# Patient Record
Sex: Female | Born: 1985 | Hispanic: Yes | Marital: Married | Smoking: Former smoker
Health system: Southern US, Community
[De-identification: ages and names within clinical notes are randomized; demographics above are authoritative.]

---

## 2018-03-22 ENCOUNTER — Emergency Department (HOSPITAL_COMMUNITY)
Admission: EM | Admit: 2018-03-22 | Discharge: 2018-03-22 | Disposition: A | Discharge status: Home / Self Care | Attending: Emergency Medicine | Admitting: Emergency Medicine

## 2018-03-22 ENCOUNTER — Emergency Department (HOSPITAL_COMMUNITY)

## 2018-03-22 ENCOUNTER — Encounter (HOSPITAL_COMMUNITY): Payer: Self-pay | Admitting: *Deleted

## 2018-03-22 ENCOUNTER — Other Ambulatory Visit: Payer: Self-pay

## 2018-03-22 DIAGNOSIS — F17210 Nicotine dependence, cigarettes, uncomplicated: Secondary | ICD-10-CM | POA: Diagnosis not present

## 2018-03-22 DIAGNOSIS — R519 Headache, unspecified: Secondary | ICD-10-CM

## 2018-03-22 DIAGNOSIS — R531 Weakness: Secondary | ICD-10-CM | POA: Diagnosis present

## 2018-03-22 DIAGNOSIS — R51 Headache: Secondary | ICD-10-CM | POA: Insufficient documentation

## 2018-03-22 DIAGNOSIS — R200 Anesthesia of skin: Secondary | ICD-10-CM

## 2018-03-22 LAB — I-STAT BETA HCG BLOOD, ED (MC, WL, AP ONLY): I-stat hCG, quantitative: <5 m[IU]/mL (ref <5)

## 2018-03-22 LAB — COMPREHENSIVE METABOLIC PANEL
ALT: 14 U/L (ref 0–44)
ANION GAP: 12 (ref 5–15)
AST: 19 U/L (ref 15–41)
Albumin: 3.6 g/dL (ref 3.5–5.0)
Alkaline Phosphatase: 73 U/L (ref 38–126)
BILIRUBIN TOTAL: 0.6 mg/dL (ref 0.3–1.2)
BUN: 7 mg/dL (ref 6–20)
CHLORIDE: 101 mmol/L (ref 98–111)
CO2: 25 mmol/L (ref 22–32)
Calcium: 9.1 mg/dL (ref 8.9–10.3)
Creatinine, Ser: 0.86 mg/dL (ref 0.44–1.00)
GFR calc Af Amer: >60 mL/min (ref >60)
GFR calc non Af Amer: >60 mL/min (ref >60)
Glucose, Bld: 114 mg/dL — ABNORMAL HIGH (ref 70–99)
POTASSIUM: 3.6 mmol/L (ref 3.5–5.1)
SODIUM: 138 mmol/L (ref 135–145)
TOTAL PROTEIN: 6.5 g/dL (ref 6.5–8.1)

## 2018-03-22 LAB — PROTIME-INR
INR: 0.98
Prothrombin Time: 12.9 seconds (ref 11.4–15.2)

## 2018-03-22 LAB — CBC
HCT: 41.3 % (ref 36.0–46.0)
Hemoglobin: 13.4 g/dL (ref 12.0–15.0)
MCH: 30.8 pg (ref 26.0–34.0)
MCHC: 32.4 g/dL (ref 30.0–36.0)
MCV: 94.9 fL (ref 78.0–100.0)
PLATELETS: 316 10*3/uL (ref 150–400)
RBC: 4.35 MIL/uL (ref 3.87–5.11)
RDW: 13.2 % (ref 11.5–15.5)
WBC: 5.2 10*3/uL (ref 4.0–10.5)

## 2018-03-22 LAB — I-STAT CHEM 8, ED
BUN: 8 mg/dL (ref 6–20)
Calcium, Ion: 1.2 mmol/L (ref 1.15–1.40)
Chloride: 101 mmol/L (ref 98–111)
Creatinine, Ser: 1 mg/dL (ref 0.44–1.00)
Glucose, Bld: 109 mg/dL — ABNORMAL HIGH (ref 70–99)
HEMATOCRIT: 40 % (ref 36.0–46.0)
Hemoglobin: 13.6 g/dL (ref 12.0–15.0)
POTASSIUM: 3.5 mmol/L (ref 3.5–5.1)
SODIUM: 137 mmol/L (ref 135–145)
TCO2: 26 mmol/L (ref 22–32)

## 2018-03-22 LAB — DIFFERENTIAL
ABS IMMATURE GRANULOCYTES: 0 10*3/uL (ref 0.0–0.1)
BASOS ABS: 0 10*3/uL (ref 0.0–0.1)
BASOS PCT: 1 %
EOS ABS: 0.1 10*3/uL (ref 0.0–0.7)
EOS PCT: 1 %
IMMATURE GRANULOCYTES: 0 %
LYMPHS ABS: 1.5 10*3/uL (ref 0.7–4.0)
Lymphocytes Relative: 28 %
Monocytes Absolute: 0.4 10*3/uL (ref 0.1–1.0)
Monocytes Relative: 8 %
NEUTROS PCT: 62 %
Neutro Abs: 3.2 10*3/uL (ref 1.7–7.7)

## 2018-03-22 LAB — APTT: APTT: 32 s (ref 24–36)

## 2018-03-22 LAB — I-STAT TROPONIN, ED: Troponin i, poc: 0.01 ng/mL (ref 0.00–0.08)

## 2018-03-22 MED ORDER — SODIUM CHLORIDE 0.9 % IV BOLUS: 1000.0000 mL | Freq: Once | INTRAVENOUS | Status: DC

## 2018-03-22 MED ORDER — DIPHENHYDRAMINE HCL 50 MG/ML IJ SOLN: 25.0000 mg | Freq: Once | INTRAMUSCULAR | Status: DC

## 2018-03-22 MED ORDER — PROCHLORPERAZINE EDISYLATE 10 MG/2ML IJ SOLN: 10.0000 mg | Freq: Once | INTRAMUSCULAR | Status: DC

## 2018-03-22 NOTE — ED Triage Notes (Signed)
Pt in c/o right sided headache and facial numbness, also numbness in right arm and leg, no weakness noted, normal strength in all extremities, reports decreased sensations to right arm and leg, and both sides of face

## 2018-03-22 NOTE — ED Notes (Signed)
Went to assess patient and pt no longer in room

## 2018-03-22 NOTE — ED Provider Notes (Signed)
[facility] EMERGENCY DEPARTMENT Provider Note   CSN: [id number] Arrival date & time: 03/22/18  1204     History   Chief Complaint Chief Complaint  Patient presents with  . Weakness    HPI Kathryn Henry is a 32 y.o. female.   Headache   This is a new problem. The current episode started 12 to 24 hours ago. The problem occurs constantly. The problem has been gradually worsening. The headache is associated with nothing. The quality of the pain is described as throbbing. The pain is at a severity of 6/10. Associated symptoms include nausea. Pertinent negatives include no fever, no palpitations, no shortness of breath and no vomiting. She has tried NSAIDs for the symptoms. The treatment provided mild relief.    History reviewed. No pertinent past medical history.  There are no active problems to display for this patient.   History reviewed. No pertinent surgical history.   OB History   None      Home Medications    Prior to Admission medications   Not on File    Family History History reviewed. No pertinent family history.  Social History Social History   Tobacco Use  . Smoking status: Current Every Day Smoker  . Smokeless tobacco: Never Used  Substance Use Topics  . Alcohol use: Not on file  . Drug use: Not on file     Allergies   Patient has no known allergies.   Review of Systems Review of Systems  Constitutional: Negative for chills and fever.  HENT: Negative for ear pain and sore throat.   Eyes: Negative for pain and visual disturbance.  Respiratory: Negative for cough and shortness of breath.   Cardiovascular: Negative for chest pain and palpitations.  Gastrointestinal: Positive for nausea. Negative for abdominal pain, diarrhea and vomiting.  Genitourinary: Negative for dysuria and frequency.  Musculoskeletal: Negative for back pain.  Skin: Negative for rash.  Neurological: Positive for headaches. Negative for seizures.    All other systems reviewed and are negative.    Physical Exam Updated Vital Signs BP 114/83   Pulse 94   Temp 98.2 F (36.8 C) (Oral)   Resp 16   Ht 5\' 5"  (1.651 m)   Wt 47.6 kg   SpO2 100%   BMI 17.47 kg/m   Physical Exam  Constitutional: She appears well-developed and well-nourished. No distress.  HENT:  Head: Normocephalic and atraumatic.  Eyes: Conjunctivae are normal.  Neck: Neck supple.  Cardiovascular: Normal rate and regular rhythm.  No murmur heard. Pulmonary/Chest: Effort normal and breath sounds normal. No respiratory distress.  Abdominal: Soft. There is no tenderness.  Musculoskeletal: She exhibits no edema.  Neurological: She is alert.  Skin: Skin is warm and dry.  Psychiatric: She has a normal mood and affect.  Nursing note and vitals reviewed.    ED Treatments / Results  Labs (all labs ordered are listed, but only abnormal results are displayed) Labs Reviewed  COMPREHENSIVE METABOLIC PANEL - Abnormal; Notable for the following components:      Result Value   Glucose, Bld 114 (*)    All other components within normal limits  I-STAT CHEM 8, ED - Abnormal; Notable for the following components:   Glucose, Bld 109 (*)    All other components within normal limits  PROTIME-INR  APTT  CBC  DIFFERENTIAL  I-STAT TROPONIN, ED  I-STAT BETA HCG BLOOD, ED ([facility], [facility], [facility] ONLY)    EKG None  Radiology Ct Head Wo Contrast  Result Date: 03/22/2018 CLINICAL DATA:  Right-sided headache and facial numbness. Right arm and leg numbness EXAM: CT HEAD WITHOUT CONTRAST TECHNIQUE: Contiguous axial images were obtained from the base of the skull through the vertex without intravenous contrast. COMPARISON:  None. FINDINGS: Brain: No evidence of acute infarction, hemorrhage, hydrocephalus, extra-axial collection or mass lesion/mass effect. Vascular: No hyperdense vessel or unexpected calcification. Skull: Negative Sinuses/Orbits: Negative Other: None IMPRESSION: Negative CT  head Electronically Signed   By: Marlan Palau M.D.   On: 03/22/2018 12:35    Procedures Procedures (including critical care time)  Medications Ordered in ED Medications - No data to display   Initial Impression / Assessment and Plan / ED Course  I have reviewed the triage vital signs and the nursing notes.  Pertinent labs & imaging results that were available during my care of the patient were reviewed by me and considered in my medical decision making (see chart for details).     The patient is a 32 year old female with no significant past medical history who presents with bilateral facial numbness and headache.  The patient denies weakness, although this is her chief complaint on arrival.  Physical exam reveals 5 out of 5 strength with flexion of bilateral upper and lower extremities.  The patient had intact sensation and no neglect on exam.  The patient is able to ambulate normally.  Patient had 2+ patellar and biceps reflexes.  Finger-to-nose normal bilaterally.  Do not suspect CVA, temporal arteritis, intracranial bleed based on history and physical.  Patient endorses pounding headache with photophobia, phonophobia, and nausea.  CT head shows no acute abnormalities.  CBC, CMP, troponin, and beta-hCG negative for abnormalities.  Suspect this is a migraine.  I ordered Compazine and Benadryl for the patient. When the patient's nurse went to assess her and give her medications the patient and her visitor (her manager) had left the room.  Awaiting for patient return, but she did not after 15 minutes and eloped.  Has not given the opportunity to give the patient return precautions, discharge instructions, and the patient did not receive her medications before leaving.  Patient care supervised by Dr. Freida Busman.  Nash Dimmer, MD   Final Clinical Impressions(s) / ED Diagnoses   Final diagnoses:  None    ED Discharge Orders    None       Nash Dimmer, MD 03/23/18 Willaim Sheng    Lorre Nick, MD 03/31/18 1228    Lorre Nick, MD 04/06/18 [phone]

## 2018-03-22 NOTE — ED Notes (Signed)
Pt still nowhere to be found; MD notified

## 2018-04-05 ENCOUNTER — Emergency Department (HOSPITAL_COMMUNITY)

## 2018-04-05 ENCOUNTER — Emergency Department (HOSPITAL_COMMUNITY)
Admission: EM | Admit: 2018-04-05 | Discharge: 2018-04-05 | Disposition: A | Discharge status: Home / Self Care | Attending: Emergency Medicine | Admitting: Emergency Medicine

## 2018-04-05 ENCOUNTER — Other Ambulatory Visit: Payer: Self-pay

## 2018-04-05 ENCOUNTER — Encounter (HOSPITAL_COMMUNITY): Payer: Self-pay

## 2018-04-05 DIAGNOSIS — R110 Nausea: Secondary | ICD-10-CM | POA: Insufficient documentation

## 2018-04-05 DIAGNOSIS — R079 Chest pain, unspecified: Secondary | ICD-10-CM

## 2018-04-05 DIAGNOSIS — Z87891 Personal history of nicotine dependence: Secondary | ICD-10-CM | POA: Insufficient documentation

## 2018-04-05 DIAGNOSIS — R0602 Shortness of breath: Secondary | ICD-10-CM | POA: Diagnosis not present

## 2018-04-05 LAB — CBC
HCT: 40 % (ref 36.0–46.0)
HEMOGLOBIN: 13.5 g/dL (ref 12.0–15.0)
MCH: 31.1 pg (ref 26.0–34.0)
MCHC: 33.8 g/dL (ref 30.0–36.0)
MCV: 92.2 fL (ref 78.0–100.0)
PLATELETS: 357 10*3/uL (ref 150–400)
RBC: 4.34 MIL/uL (ref 3.87–5.11)
RDW: 13.5 % (ref 11.5–15.5)
WBC: 7.7 10*3/uL (ref 4.0–10.5)

## 2018-04-05 LAB — POCT I-STAT TROPONIN I: Troponin i, poc: 0 ng/mL (ref 0.00–0.08)

## 2018-04-05 LAB — BASIC METABOLIC PANEL
ANION GAP: 10 (ref 5–15)
BUN: 10 mg/dL (ref 6–20)
CALCIUM: 8.9 mg/dL (ref 8.9–10.3)
CHLORIDE: 104 mmol/L (ref 98–111)
CO2: 27 mmol/L (ref 22–32)
CREATININE: 0.65 mg/dL (ref 0.44–1.00)
GFR calc Af Amer: >60 mL/min (ref >60)
Glucose, Bld: 92 mg/dL (ref 70–99)
POTASSIUM: 3.5 mmol/L (ref 3.5–5.1)
SODIUM: 141 mmol/L (ref 135–145)

## 2018-04-05 LAB — I-STAT BETA HCG BLOOD, ED (MC, WL, AP ONLY)

## 2018-04-05 NOTE — ED Triage Notes (Signed)
Patient c/o intermittent mid chest pain with SOB and nausea that began 3 days ago. CP began today at 1100.

## 2018-04-05 NOTE — ED Provider Notes (Signed)
[facility]-EMERGENCY DEPT Provider Note   CSN: [id number] Arrival date & time: 04/05/18  1308     History   Chief Complaint Chief Complaint  Patient presents with  . Chest Pain    HPI Kathryn Henry is a 32 y.o. female.  The history is provided by the patient.  Chest Pain   This is a recurrent problem. The current episode started more than 2 days ago. The problem occurs daily. The problem has been resolved. Associated with: Patient states every time she takes her prozac over the past week she gets chest pain, difficulty breathing and then it resolves. Has had bad reactions to prozac in past. Increased dose last week.  The pain is present in the substernal region. The pain is at a severity of 0/10. The patient is experiencing no pain. The quality of the pain is described as brief. The pain does not radiate. Associated symptoms include shortness of breath. Pertinent negatives include no abdominal pain, no back pain, no cough, no fever, no hemoptysis, no lower extremity edema, no malaise/fatigue, no nausea, no orthopnea, no palpitations, no syncope, no vomiting and no weakness. She has tried nothing for the symptoms. The treatment provided no relief. There are no known risk factors.  Pertinent negatives for past medical history include no seizures.  Pertinent negatives for family medical history include: no CAD.    History reviewed. No pertinent past medical history.  There are no active problems to display for this patient.   History reviewed. No pertinent surgical history.   OB History   None      Home Medications    Prior to Admission medications   Not on File    Family History History reviewed. No pertinent family history.  Social History Social History   Tobacco Use  . Smoking status: Former Games developermoker  . Smokeless tobacco: Never Used  Substance Use Topics  . Alcohol use: Never    Frequency: Never  . Drug use: Never     Allergies     Patient has no known allergies.   Review of Systems Review of Systems  Constitutional: Negative for chills, fever and malaise/fatigue.  HENT: Negative for ear pain and sore throat.   Eyes: Negative for pain and visual disturbance.  Respiratory: Positive for shortness of breath. Negative for cough and hemoptysis.   Cardiovascular: Positive for chest pain. Negative for palpitations, orthopnea and syncope.  Gastrointestinal: Negative for abdominal pain, nausea and vomiting.  Genitourinary: Negative for dysuria and hematuria.  Musculoskeletal: Negative for arthralgias and back pain.  Skin: Negative for color change and rash.  Neurological: Negative for seizures, syncope and weakness.  All other systems reviewed and are negative.    Physical Exam Updated Vital Signs  ED Triage Vitals  Enc Vitals Group     BP 04/05/18 1327 122/89     Pulse --      Resp 04/05/18 1327 14     Temp 04/05/18 1327 98.3 F (36.8 C)     Temp Source 04/05/18 1327 Oral     SpO2 04/05/18 1327 93 %     Weight 04/05/18 1327 105 lb (47.6 kg)     Height 04/05/18 1327 5\' 5"  (1.651 m)     Head Circumference --      Peak Flow --      Pain Score 04/05/18 1330 5     Pain Loc --      Pain Edu? --      Excl. in GC? --  Physical Exam  Constitutional: She appears well-developed and well-nourished. No distress.  HENT:  Head: Normocephalic and atraumatic.  Eyes: Pupils are equal, round, and reactive to light. Conjunctivae and EOM are normal.  Neck: Normal range of motion. Neck supple.  Cardiovascular: Normal rate, regular rhythm, intact distal pulses and normal pulses.  No murmur heard. Pulmonary/Chest: Effort normal and breath sounds normal. No respiratory distress. She has no decreased breath sounds. She has no wheezes. She has no rhonchi. She has no rales.  Abdominal: Soft. There is no tenderness.  Musculoskeletal: Normal range of motion. She exhibits no edema.       Right lower leg: She exhibits no  edema.       Left lower leg: She exhibits no edema.  Neurological: She is alert.  Skin: Skin is warm and dry. Capillary refill takes less than 2 seconds.  Psychiatric: She has a normal mood and affect.  Nursing note and vitals reviewed.    ED Treatments / Results  Labs (all labs ordered are listed, but only abnormal results are displayed) Labs Reviewed  BASIC METABOLIC PANEL  CBC  I-STAT TROPONIN, ED  I-STAT BETA HCG BLOOD, ED ([facility], [facility], [facility] ONLY)  POCT I-STAT TROPONIN I    EKG EKG Interpretation  Date/Time:  Tuesday April 05 2018 13:32:58 EDT Ventricular Rate:  95 PR Interval:    QRS Duration: 101 QT Interval:  342 QTC Calculation: 430 R Axis:   122 Text Interpretation:  Sinus rhythm Right atrial enlargement Right axis deviation Borderline T abnormalities, anterior leads Confirmed by Virgina Norfolk [phone]) on 04/05/2018 3:58:12 PM   Radiology Dg Chest 2 View  Result Date: 04/05/2018 CLINICAL DATA:  Intermittent episodes of mid chest pain, shortness of breath, and nausea over the past 3 days. EXAM: CHEST - 2 VIEW COMPARISON:  None. FINDINGS: The lungs are mildly hyperinflated. There is no focal infiltrate. There is no pleural effusion. The heart and pulmonary vascularity are normal. There is mild S shaped thoracolumbar scoliosis. The thoracic vertebral bodies are preserved in height. IMPRESSION: Mild hyperinflation may reflect reactive airway disease and the patient's smoking history. No pneumonia, CHF, nor other acute cardiopulmonary abnormality. Electronically Signed   By: David  [place] M.D.   On: 04/05/2018 15:03    Procedures Procedures (including critical care time)  Medications Ordered in ED Medications - No data to display   Initial Impression / Assessment and Plan / ED Course  I have reviewed the triage vital signs and the nursing notes.  Pertinent labs & imaging results that were available during my care of the patient were reviewed by me and considered in my  medical decision making (see chart for details).     Kathryn Henry is a 32 year old female history of depression who presents to the ED with chest pain.  Patient with unremarkable vitals.  No fever.  Patient with symptoms over the last several days that she thinks is associated with taking her Prozac.  She has history of side effects with Prozac in the past with paresthesias.  She states that since she had her Prozac increased last week she has had increased chest tightness, shortness of breath, nausea feeling.  Patient has no cardiac risk factors.  She is on estrogen for birth control but has no other PE risk factors.  Wells criteria 0 and no concern for PE.  Patient pain-free at this time.  Denies any cough, sputum production.  Chest x-ray showed no signs of pneumonia, no pneumothorax, no pleural effusion.  No significant anemia, electrolyte abnormality, kidney injury.  EKG shows sinus rhythm.  Troponin within normal limits.  Doubt cardiac process.  No cardiac history in the family.  Possibly patient with medication side effect.  Possible anxiety as well.  Recommend discontinuing Prozac at this time and following up with primary care doctor which she has in place. Discharged from ED good condition, given return precautions.   This chart was dictated using voice recognition software.  Despite best efforts to proofread,  errors can occur which can change the documentation meaning.   Final Clinical Impressions(s) / ED Diagnoses   Final diagnoses:  Chest pain, unspecified type    ED Discharge Orders    None       Virgina Norfolk, DO 04/05/18 1635

## 2018-04-28 DIAGNOSIS — F988 Other specified behavioral and emotional disorders with onset usually occurring in childhood and adolescence: Secondary | ICD-10-CM | POA: Insufficient documentation

## 2018-05-05 ENCOUNTER — Ambulatory Visit: Payer: Self-pay | Admitting: Psychiatry

## 2018-06-02 ENCOUNTER — Other Ambulatory Visit: Payer: Self-pay | Admitting: Psychiatry

## 2018-06-02 MED ORDER — DULOXETINE HCL 60 MG PO CPEP: 60.0000 mg | ORAL_CAPSULE | Freq: Every day | ORAL | 0 refills | Status: DC

## 2018-06-02 MED ORDER — AMPHETAMINE-DEXTROAMPHETAMINE 30 MG PO TABS: 75.0000 mg | ORAL_TABLET | Freq: Every day | ORAL | 0 refills | Status: DC | PRN

## 2018-06-27 ENCOUNTER — Ambulatory Visit (INDEPENDENT_AMBULATORY_CARE_PROVIDER_SITE_OTHER): Admitting: Psychiatry

## 2018-06-27 DIAGNOSIS — F411 Generalized anxiety disorder: Secondary | ICD-10-CM

## 2018-06-27 DIAGNOSIS — F429 Obsessive-compulsive disorder, unspecified: Secondary | ICD-10-CM | POA: Diagnosis not present

## 2018-06-27 DIAGNOSIS — F988 Other specified behavioral and emotional disorders with onset usually occurring in childhood and adolescence: Secondary | ICD-10-CM

## 2018-06-27 DIAGNOSIS — F3289 Other specified depressive episodes: Secondary | ICD-10-CM

## 2018-06-27 MED ORDER — FLUOXETINE HCL 20 MG PO TABS: ORAL_TABLET | ORAL | 1 refills | Status: DC

## 2018-06-27 MED ORDER — AMPHETAMINE-DEXTROAMPHETAMINE 30 MG PO TABS: 75.0000 mg | ORAL_TABLET | Freq: Every day | ORAL | 0 refills | Status: DC | PRN

## 2018-06-27 MED ORDER — ARIPIPRAZOLE 2 MG PO TABS: ORAL_TABLET | ORAL | 0 refills | Status: DC

## 2018-06-27 MED ORDER — GABAPENTIN 600 MG PO TABS: 600.0000 mg | ORAL_TABLET | Freq: Two times a day (BID) | ORAL | 1 refills | Status: DC

## 2018-06-27 MED ORDER — DIAZEPAM 5 MG PO TABS: 5.0000 mg | ORAL_TABLET | Freq: Every day | ORAL | 0 refills | Status: DC | PRN

## 2018-06-27 NOTE — Progress Notes (Signed)
[facility]  Patient ID: Zane HeraldCynthia Henry,  MRN: [id number]  PCP: Patient, No Pcp Per  Date of Evaluation: 06/27/2018 Time spent:20 minutes  Chief Complaint:   HISTORY/CURRENT STATUS: HPI patient last seen 03/31/2018.  Her depression was better.  Continued with daily anxiety.  We increased her Prozac and continued her Adderall.  Patient called a week later with heartburn after she takes the Prozac 40.  She was switched from Prozac to Cymbalta.  Since switching to Cymbalta he notices an increased anxiety, decreased energy, forgetfulness.  Her depression is also worse. Current medications are Cymbalta 60 mg 1 a day Valium 5 mg 1 a day, Neurontin 600 mg 1-2 a day, Adderall 30 mg 2-1/2 a day. She did well on Prozac 40 mg.  When we increase it to 60 she had side effects. Past psychiatric medications include Paxil, Prozac, Viibryd, Wellbutrin, Valium, gabapentin, Lexapro, Adderall.   Individual Medical History/ Review of Systems: Changes? :none  Allergies: Patient has no known allergies.  Current Medications:  Current Outpatient Medications:  .  amphetamine-dextroamphetamine (ADDERALL) 30 MG tablet, Take 2.5 tablets by mouth daily as needed. For energy, Disp: 75 tablet, Rfl: 0 .  diazepam (VALIUM) 5 MG tablet, Take 1 tablet (5 mg total) by mouth daily as needed., Disp: 30 tablet, Rfl: 0 .  gabapentin (NEURONTIN) 600 MG tablet, Take 1 tablet (600 mg total) by mouth 2 (two) times daily., Disp: 60 tablet, Rfl: 1 .  Adapalene 0.3 % gel, Apply 1 application topically at bedtime. , Disp: , Rfl: 1 .  ARIPiprazole (ABILIFY) 2 MG tablet, 1/2 tab /day for a week then 1/day, Disp: 30 tablet, Rfl: 0 .  FLUoxetine (PROZAC) 20 MG tablet, 1 tab/day for 1 week, then 2 tabs/day, Disp: 60 tablet, Rfl: 1 .  JUNEL FE 1/20 1-20 MG-MCG tablet, Take 1 tablet by mouth daily., Disp: , Rfl: 0 .  ketoconazole (NIZORAL) 2 % cream, Apply 1 application topically daily., Disp: , Rfl: 2 .  rizatriptan  (MAXALT-MLT) 10 MG disintegrating tablet, Take 10 mg by mouth as needed for migraine. , Disp: , Rfl: 0 Medication Side Effects: Decreased energy forgetfulness, Family Medical/ Social History: Changes? no  MENTAL HEALTH EXAM:  There were no vitals taken for this visit.There is no height or weight on file to calculate BMI.  General Appearance: Casual  Eye Contact:  Good  Speech:  Clear and Coherent  Volume:  Normal  Mood:  Anxious and Depressed  Affect:  Appropriate  Thought Process:  Linear  Orientation:  Full (Time, Place, and Person)  Thought Content: Logical   Suicidal Thoughts:  No  Homicidal Thoughts:  No  Memory:  WNL  Judgement:  Good  Insight:  Good  Psychomotor Activity:  Normal  Concentration:  Concentration: Good  Recall:  good  Fund of Knowledge: Good  Language: Good  Assets:  Desire for Improvement  ADL's:  Intact  Cognition: WNL  Prognosis:  Good    DIAGNOSES:    ICD-10-CM   1. Attention deficit disorder, unspecified hyperactivity presence F98.8   2. Other depression F32.89   3. Anxiety state F41.1   4. Obsessive-compulsive disorder, unspecified type F42.9     Receiving Psychotherapy: No    RECOMMENDATIONS: We could try Zoloft but she is already failed from 3 antidepressants.  Patient desires to go on to back on Prozac  to do that 20 mg a day for a week and then 40 mg a day.  Also try to start her on  Abilify 1 mg a day for a week and then 2 mg  For augmentation of her Prozac.   Anne Fu, PA-C

## 2018-06-28 ENCOUNTER — Telehealth: Payer: Self-pay | Admitting: Psychiatry

## 2018-06-28 ENCOUNTER — Other Ambulatory Visit: Payer: Self-pay | Admitting: Psychiatry

## 2018-06-28 MED ORDER — VORTIOXETINE HBR 10 MG PO TABS: ORAL_TABLET | ORAL | 1 refills | Status: DC

## 2018-06-28 NOTE — Telephone Encounter (Signed)
Pt seen yesterday and restarted on Prozac 20mg /day. 1 hr after dose, she had chest pain. Still present but better now. Sore to touch. To d/c prozac Start trintellix.5-[facility] [address] in [place].

## 2018-07-11 ENCOUNTER — Other Ambulatory Visit: Payer: Self-pay | Admitting: Psychiatry

## 2018-07-14 ENCOUNTER — Other Ambulatory Visit: Payer: Self-pay | Admitting: Psychiatry

## 2018-07-14 MED ORDER — DULOXETINE HCL 30 MG PO CPEP: ORAL_CAPSULE | ORAL | 0 refills | Status: DC

## 2018-07-14 NOTE — Progress Notes (Signed)
Pt with problems tapering off Cymbalta. Called in cymbalta 30 mg/day 2/day for 2 weeks, then 1 day for 2 weeks. Then stop.

## 2018-07-20 ENCOUNTER — Encounter: Payer: Self-pay | Admitting: Psychiatry

## 2018-07-20 ENCOUNTER — Telehealth: Payer: Self-pay | Admitting: Psychiatry

## 2018-07-20 NOTE — Telephone Encounter (Signed)
Pt. Called and wants to talk to you about the tapering off of the cymbalta. Please call her at [phone].

## 2018-07-25 ENCOUNTER — Ambulatory Visit (INDEPENDENT_AMBULATORY_CARE_PROVIDER_SITE_OTHER): Admitting: Psychiatry

## 2018-07-25 DIAGNOSIS — F329 Major depressive disorder, single episode, unspecified: Secondary | ICD-10-CM | POA: Diagnosis not present

## 2018-07-25 DIAGNOSIS — F411 Generalized anxiety disorder: Secondary | ICD-10-CM

## 2018-07-25 DIAGNOSIS — F32A Depression, unspecified: Secondary | ICD-10-CM

## 2018-07-25 DIAGNOSIS — F988 Other specified behavioral and emotional disorders with onset usually occurring in childhood and adolescence: Secondary | ICD-10-CM

## 2018-07-25 MED ORDER — DIAZEPAM 5 MG PO TABS: 5.0000 mg | ORAL_TABLET | Freq: Every day | ORAL | 0 refills | Status: DC | PRN

## 2018-07-25 MED ORDER — PAROXETINE HCL 10 MG PO TABS: 10.0000 mg | ORAL_TABLET | Freq: Every day | ORAL | 0 refills | Status: DC

## 2018-07-25 MED ORDER — DULOXETINE HCL 20 MG PO CPEP: 20.0000 mg | ORAL_CAPSULE | Freq: Every day | ORAL | 0 refills | Status: DC

## 2018-07-25 MED ORDER — ARIPIPRAZOLE 2 MG PO TABS: ORAL_TABLET | ORAL | 0 refills | Status: DC

## 2018-07-25 MED ORDER — GABAPENTIN 600 MG PO TABS: 600.0000 mg | ORAL_TABLET | Freq: Two times a day (BID) | ORAL | 1 refills | Status: DC

## 2018-07-25 MED ORDER — AMPHETAMINE-DEXTROAMPHETAMINE 30 MG PO TABS: 75.0000 mg | ORAL_TABLET | Freq: Every day | ORAL | 0 refills | Status: DC | PRN

## 2018-07-25 NOTE — Progress Notes (Signed)
[facility]  Patient ID: Kathryn Henry,  MRN: [id number]  PCP: Patient, No Pcp Per  Date of Evaluation: 07/25/2018 Time spent:20 minutes  Chief Complaint:   HISTORY/CURRENT STATUS: HPI patient last seen 06/27/2018.  He was unable to tolerate the Prozac so he switched to Trintellix.  She stopped Trintellix new due to nausea.  He is also been on and Cymbalta but she has had increased anxiety we have tapered we are tapering her off very slowly.  She is tried Viibryd in the past where she had nightmares. Currently she is having no depression but has anxiety. Individual Medical History/ Review of Systems: Changes? :No   Allergies: Patient has no known allergies.  Current Medications:  Current Outpatient Medications:  .  Adapalene 0.3 % gel, Apply 1 application topically at bedtime. , Disp: , Rfl: 1 .  amphetamine-dextroamphetamine (ADDERALL) 30 MG tablet, Take 2.5 tablets by mouth daily as needed. For energy, Disp: 75 tablet, Rfl: 0 .  ARIPiprazole (ABILIFY) 2 MG tablet, 1/2 tab /day for a week then 1/day, Disp: 30 tablet, Rfl: 0 .  diazepam (VALIUM) 5 MG tablet, Take 1 tablet (5 mg total) by mouth daily as needed., Disp: 30 tablet, Rfl: 0 .  DULoxetine (CYMBALTA) 30 MG capsule, 2 a day for 2 weeks, then 1 /day for 2 weeks. Then stop., Disp: 42 capsule, Rfl: 0 .  FLUoxetine (PROZAC) 20 MG tablet, 1 tab/day for 1 week, then 2 tabs/day, Disp: 60 tablet, Rfl: 1 .  gabapentin (NEURONTIN) 600 MG tablet, Take 1 tablet (600 mg total) by mouth 2 (two) times daily., Disp: 60 tablet, Rfl: 1 .  JUNEL FE 1/20 1-20 MG-MCG tablet, Take 1 tablet by mouth daily., Disp: , Rfl: 0 .  ketoconazole (NIZORAL) 2 % cream, Apply 1 application topically daily., Disp: , Rfl: 2 .  rizatriptan (MAXALT-MLT) 10 MG disintegrating tablet, Take 10 mg by mouth as needed for migraine. , Disp: , Rfl: 0 .  vortioxetine HBr (TRINTELLIX) 10 MG TABS tablet, 1/2 tab/day for a week, then 1 tab/day.1, Disp: 30 tablet,  Rfl: 1 Medication Side Effects: none  Family Medical/ Social History: Changes? No  MENTAL HEALTH EXAM:  There were no vitals taken for this visit.There is no height or weight on file to calculate BMI.  General Appearance: Casual  Eye Contact:  Good  Speech:  Normal Rate  Volume:  Normal  Mood:  Euthymic  Affect:  Appropriate  Thought Process:  Linear  Orientation:  Full (Time, Place, and Person)  Thought Content: Logical   Suicidal Thoughts:  No  Homicidal Thoughts:  No  Memory:  WNL  Judgement:  Good  Insight:  Good  Psychomotor Activity:  Normal  Concentration:  Concentration: Good  Recall:  Good  Fund of Knowledge: Good  Language: Good  Assets:  Desire for Improvement  ADL's:  Intact  Cognition: WNL  Prognosis:  Good    DIAGNOSES: No diagnosis found.  Receiving Psychotherapy: No    RECOMMENDATIONS: She is to continue tapering off the Cymbalta.  She is to start Paxil 10 mg a day.  Continue Abilify 2 mg a day on Valium 5 mg a day.  On gabapentin.  She is to return in 1 month.   Anne Fu, PA-C

## 2018-07-26 ENCOUNTER — Ambulatory Visit: Admitting: Psychiatry

## 2018-08-22 ENCOUNTER — Other Ambulatory Visit: Payer: Self-pay | Admitting: Psychiatry

## 2018-09-21 ENCOUNTER — Other Ambulatory Visit: Payer: Self-pay | Admitting: Psychiatry

## 2018-09-22 ENCOUNTER — Other Ambulatory Visit: Payer: Self-pay

## 2018-09-22 ENCOUNTER — Telehealth: Payer: Self-pay | Admitting: Psychiatry

## 2018-09-22 ENCOUNTER — Other Ambulatory Visit: Payer: Self-pay | Admitting: Psychiatry

## 2018-09-22 MED ORDER — GABAPENTIN 600 MG PO TABS: 600.0000 mg | ORAL_TABLET | Freq: Two times a day (BID) | ORAL | 0 refills | Status: DC

## 2018-09-22 NOTE — Telephone Encounter (Signed)
Spoke with pt and she has completely tapered off duloxetine, did not like the paroxetine. Asking to go back on Duloxetine. Has office visit Monday 03/16 with provider. Will submit refill for gabapentin 600mg  bid today.

## 2018-09-22 NOTE — Telephone Encounter (Signed)
Patient scheduled appt. For Monday but needs refills on Cymbalta and Gabapentin to be sent to [facility] on file

## 2018-09-26 ENCOUNTER — Telehealth: Payer: Self-pay | Admitting: Psychiatry

## 2018-09-26 ENCOUNTER — Ambulatory Visit: Admitting: Psychiatry

## 2018-09-26 ENCOUNTER — Other Ambulatory Visit: Payer: Self-pay

## 2018-09-26 MED ORDER — AMPHETAMINE-DEXTROAMPHETAMINE 30 MG PO TABS: 75.0000 mg | ORAL_TABLET | Freq: Every day | ORAL | 0 refills | Status: DC | PRN

## 2018-09-26 NOTE — Telephone Encounter (Signed)
rx submitted to provider for approval  

## 2018-09-26 NOTE — Telephone Encounter (Signed)
Patient requesting refill on Adderall. Please fill at the [facility] on [address].

## 2018-09-28 ENCOUNTER — Telehealth: Payer: Self-pay | Admitting: Psychiatry

## 2018-09-28 ENCOUNTER — Other Ambulatory Visit: Payer: Self-pay

## 2018-09-28 MED ORDER — DULOXETINE HCL 30 MG PO CPEP: 30.0000 mg | ORAL_CAPSULE | Freq: Every day | ORAL | 0 refills | Status: DC

## 2018-09-28 NOTE — Progress Notes (Signed)
Pt asking to get a rx for duloxetine 30mg , tried to taper down to 20mg  but unsuccessful. rx submitted per request.

## 2018-09-28 NOTE — Telephone Encounter (Signed)
rx submitted.  

## 2018-09-28 NOTE — Telephone Encounter (Signed)
Yes it is okay to increase duloxetine back to 30 mg daily

## 2018-09-28 NOTE — Telephone Encounter (Signed)
Patient called and said that she wants to go back up on the cymbalta 30 mg. She was trying to decrease to 20 mg but not doing well wants to go back up. Please escribe to the [facility] on [address]

## 2018-10-19 ENCOUNTER — Other Ambulatory Visit: Payer: Self-pay | Admitting: Psychiatry

## 2018-10-25 ENCOUNTER — Other Ambulatory Visit: Payer: Self-pay | Admitting: Psychiatry

## 2019-01-08 IMAGING — CR DG CHEST 2V
2 series · 2 of 2 positions shown · non-contrast
Comparison: None.

CLINICAL DATA: Intermittent episodes of mid chest pain, shortness
of breath, and nausea over the past 3 days.

EXAM:
CHEST - 2 VIEW

[w chest pa]
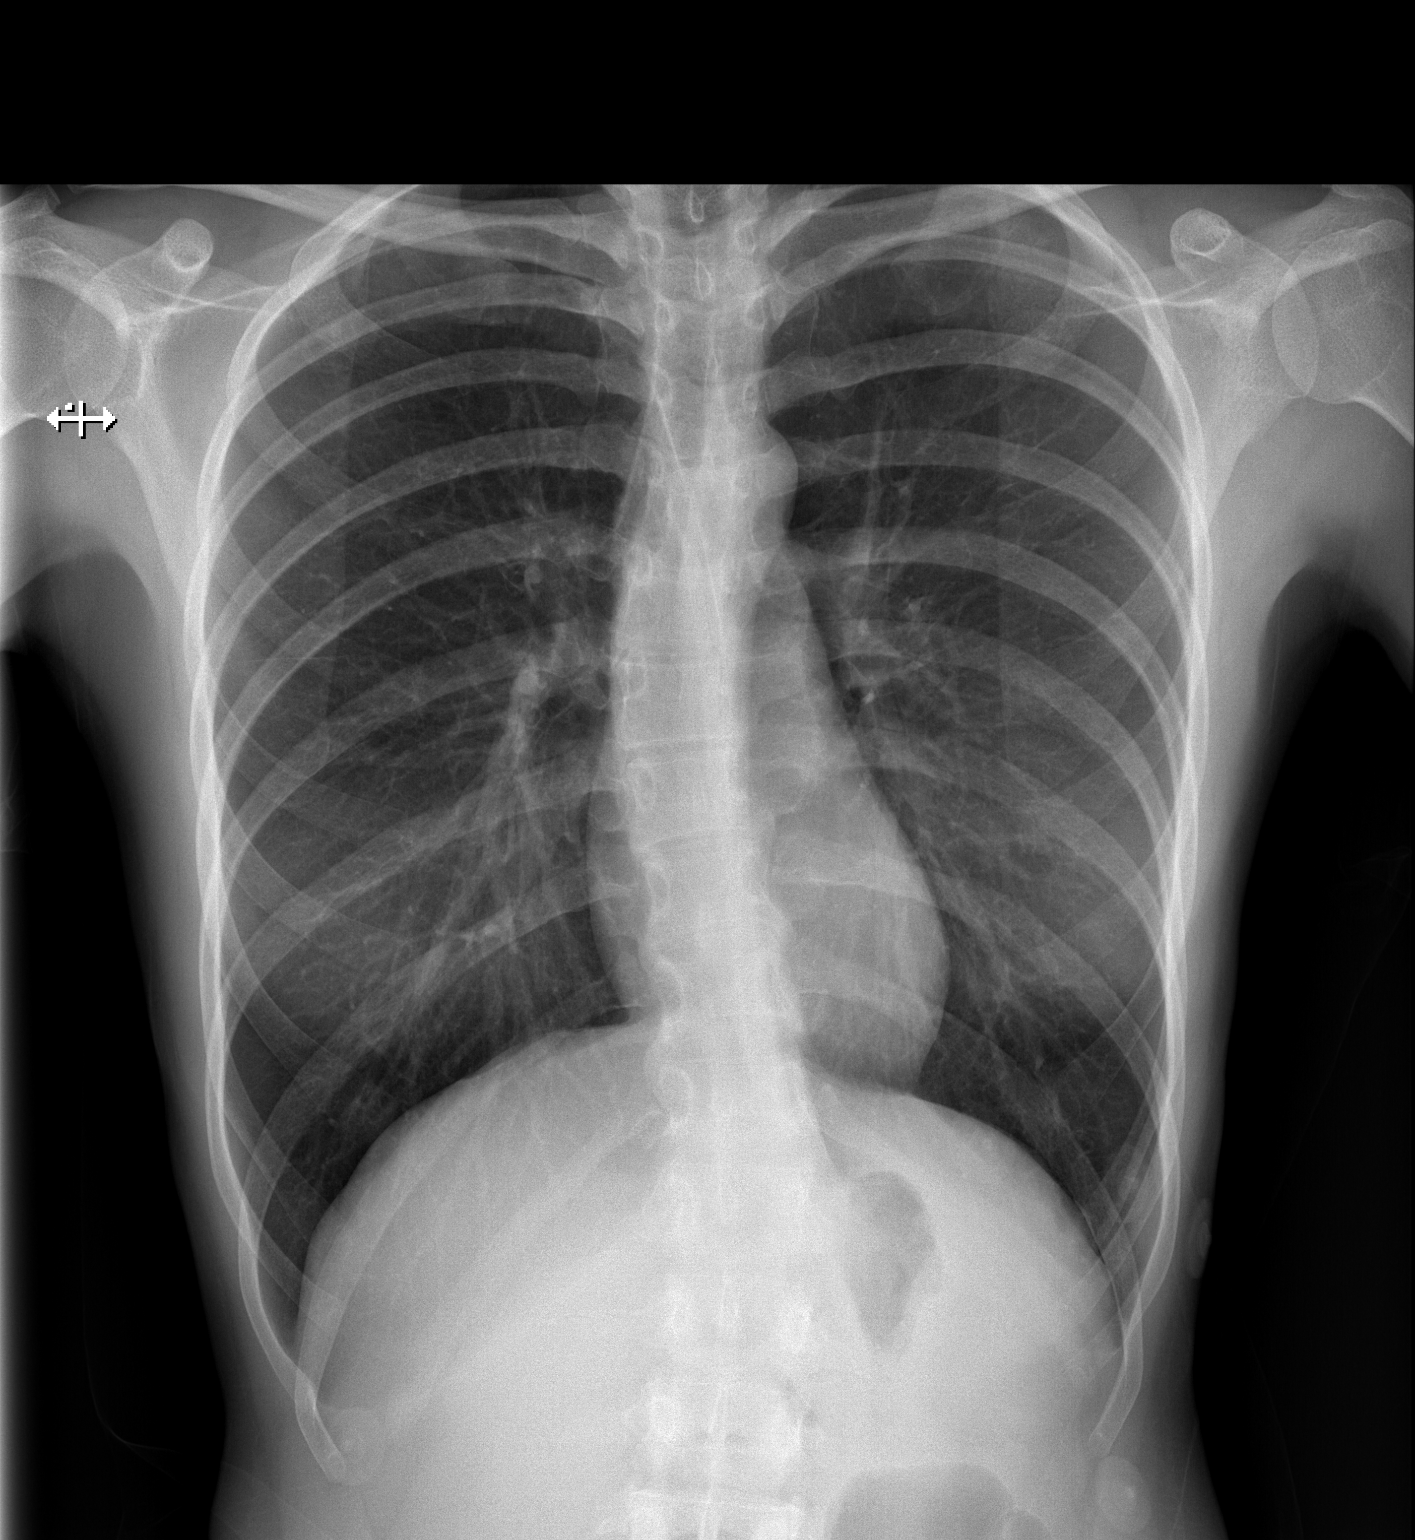

[w chest lat]
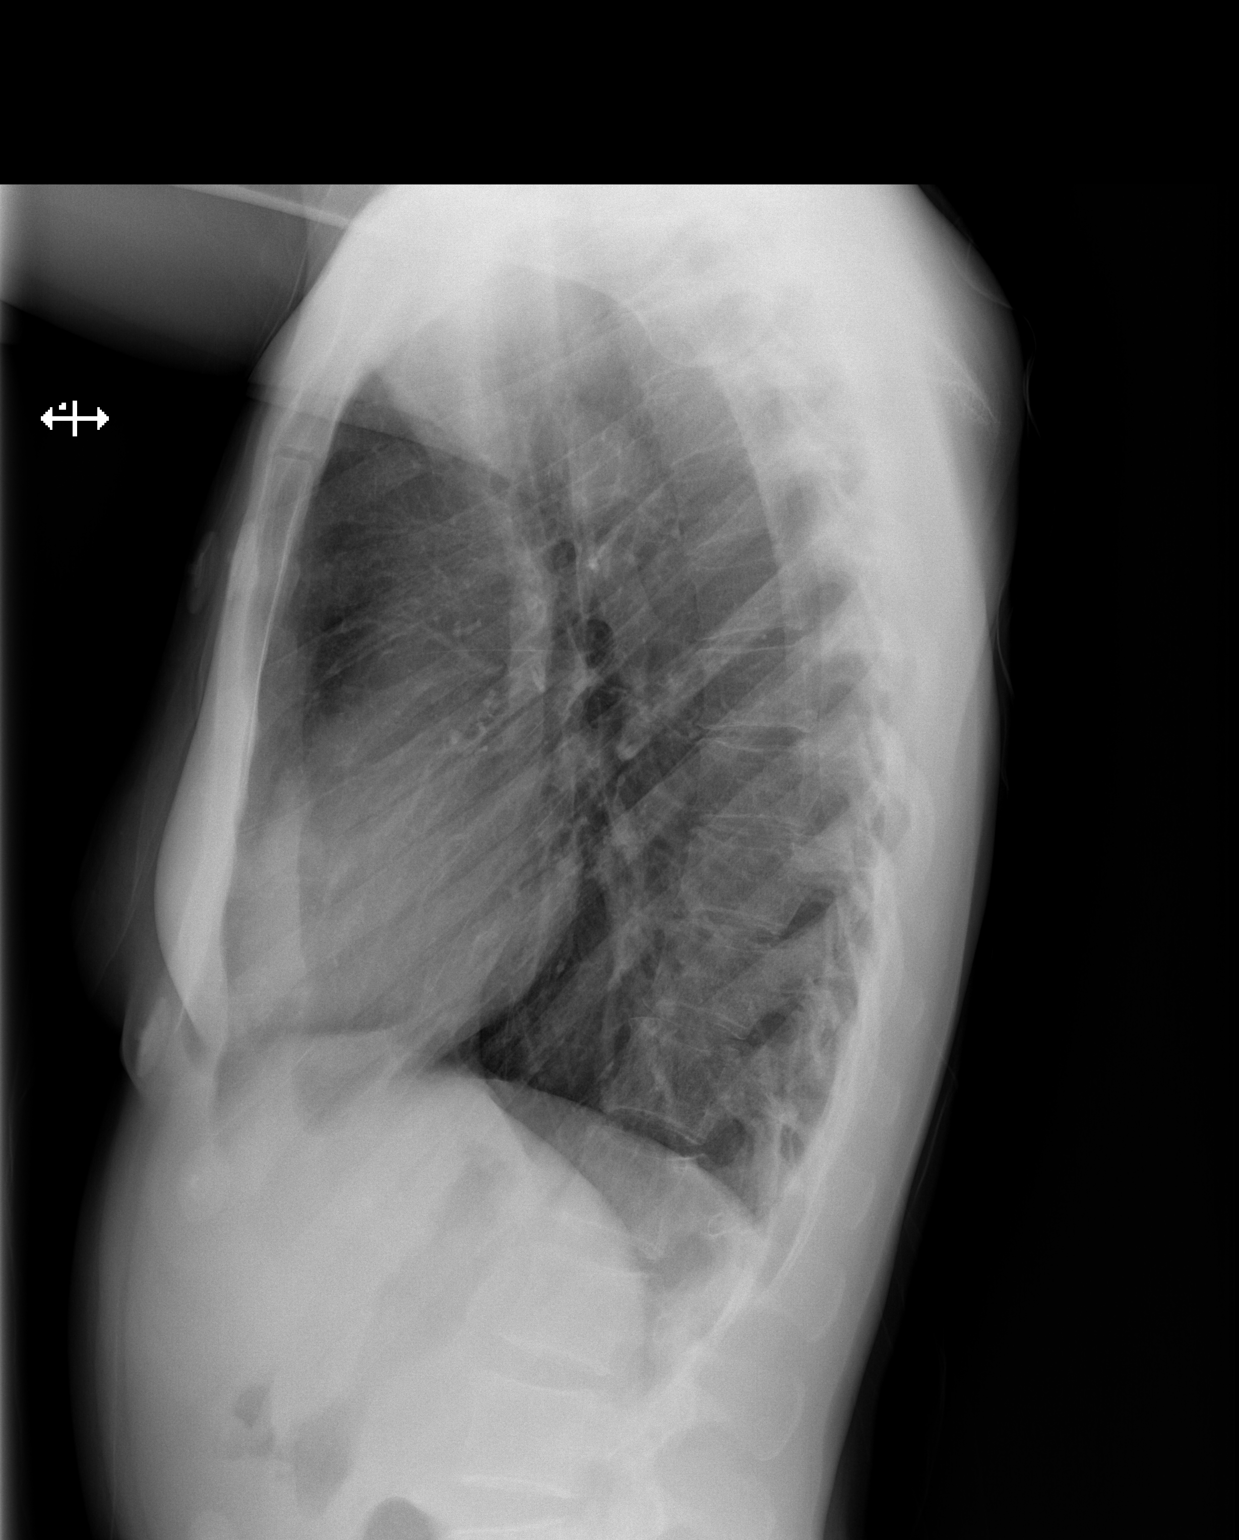

[2 of 2 positions shown; findings below may reference images not displayed]

FINDINGS: The lungs are mildly hyperinflated. There is no focal infiltrate.
There is no pleural effusion. The heart and pulmonary vascularity
are normal. There is mild S shaped thoracolumbar scoliosis. The
thoracic vertebral bodies are preserved in height.
IMPRESSION: Mild hyperinflation may reflect reactive airway disease and the
patient's smoking history. No pneumonia, CHF, nor other acute
cardiopulmonary abnormality.

## 2019-01-09 ENCOUNTER — Other Ambulatory Visit: Payer: Self-pay | Admitting: Psychiatry

## 2019-01-09 NOTE — Telephone Encounter (Signed)
Needs appt

## 2019-01-11 ENCOUNTER — Other Ambulatory Visit: Payer: Self-pay | Admitting: Psychiatry

## 2019-01-12 NOTE — Telephone Encounter (Signed)
Kathryn Henry called to request refill of her duloxetine, gabapentin and Adderall since she can't get an appt right away.  She was a [employer] pt.  She is now scheduled to see [name] 02/20/19.  [facility] send to [facility] on [address].

## 2019-01-13 ENCOUNTER — Other Ambulatory Visit: Payer: Self-pay

## 2019-01-16 MED ORDER — GABAPENTIN 600 MG PO TABS: ORAL_TABLET | ORAL | 1 refills | Status: DC

## 2019-01-16 MED ORDER — AMPHETAMINE-DEXTROAMPHETAMINE 30 MG PO TABS: 75.0000 mg | ORAL_TABLET | Freq: Every day | ORAL | 0 refills | Status: DC | PRN

## 2019-02-20 ENCOUNTER — Ambulatory Visit: Admitting: Physician Assistant

## 2019-02-27 ENCOUNTER — Encounter: Payer: Self-pay | Admitting: Adult Health

## 2019-02-27 ENCOUNTER — Other Ambulatory Visit: Payer: Self-pay

## 2019-02-27 ENCOUNTER — Ambulatory Visit (INDEPENDENT_AMBULATORY_CARE_PROVIDER_SITE_OTHER): Admitting: Adult Health

## 2019-02-27 VITALS — Ht 67.0 in | Wt 127.0 lb

## 2019-02-27 DIAGNOSIS — F329 Major depressive disorder, single episode, unspecified: Secondary | ICD-10-CM | POA: Insufficient documentation

## 2019-02-27 DIAGNOSIS — F331 Major depressive disorder, recurrent, moderate: Secondary | ICD-10-CM

## 2019-02-27 DIAGNOSIS — F3341 Major depressive disorder, recurrent, in partial remission: Secondary | ICD-10-CM

## 2019-02-27 DIAGNOSIS — F902 Attention-deficit hyperactivity disorder, combined type: Secondary | ICD-10-CM | POA: Diagnosis not present

## 2019-02-27 DIAGNOSIS — G4700 Insomnia, unspecified: Secondary | ICD-10-CM

## 2019-02-27 DIAGNOSIS — F411 Generalized anxiety disorder: Secondary | ICD-10-CM

## 2019-02-27 MED ORDER — AMPHETAMINE-DEXTROAMPHET ER 30 MG PO CP24: 30.0000 mg | ORAL_CAPSULE | Freq: Every day | ORAL | 0 refills | Status: DC

## 2019-02-27 MED ORDER — DULOXETINE HCL 60 MG PO CPEP: 60.0000 mg | ORAL_CAPSULE | Freq: Every day | ORAL | 2 refills | Status: DC

## 2019-02-27 MED ORDER — DIAZEPAM 5 MG PO TABS: 5.0000 mg | ORAL_TABLET | Freq: Every day | ORAL | 2 refills | Status: DC | PRN

## 2019-02-27 MED ORDER — GABAPENTIN 600 MG PO TABS: ORAL_TABLET | ORAL | 2 refills | Status: DC

## 2019-02-27 NOTE — Progress Notes (Signed)
Kathryn HeraldCynthia Faust [id number] 1985-12-07 33 y.o.  Subjective:   Patient ID:  Kathryn Henry is a 33 y.o. (DOB 1985-12-07) female.  Chief Complaint:  Chief Complaint  Patient presents with  . Depression  . Anxiety  . ADHD  . Insomnia    HPI Kathryn HeraldCynthia Brothers presents to the office today for follow-up of depression, anxiety, ADHD, and insomnia.   Describes mood today as "ok". Pleasant. Flat. Mood symptoms - decreased depression, anxiety, and irritability. Has felt more irritable lately. Getting "frustrated" at patients coming into office - not wanting to comply with new regulations. Stating "I'm doing better". Had tapered herself off al medications in June, and then realized she needed them. Had stopped getting out of bed in the mornings, but having a easier time now. Stable interest and motivation Taking medications as prescribed.  Energy levels "so-so". Relies on Adderall for energy. Has reduced dose and is now taking 30mg  daily - (15 bid).  Enjoys some usual interests and activities. Spending time with family. Has a 33 year old daughter. Daughter visits with her father every summer for the past 4 years. Father moving to [place] next year.  Appetite adequate. Weight gain - "a few pounds". Sleeps well most nights. Averages 8 to 9 hours. Focus and concentration stable. Completing tasks. Managing aspects of household. Work going well. Was diagnosed with ADHD in  2015. Adderall has historically worked well.  Denies SI or HI. Denies AH or VH.   Review of Systems:  Review of Systems  Musculoskeletal: Negative for gait problem.  Neurological: Negative for tremors.  Psychiatric/Behavioral:       Please refer to HPI    Medications: I have reviewed the patient's current medications.  Current Outpatient Medications  Medication Sig Dispense Refill  . Adapalene 0.3 % gel Apply 1 application topically at bedtime.   1  . amitriptyline (ELAVIL) 10 MG tablet Take 20 mg by mouth at bedtime as needed.     Marland Kitchen. amphetamine-dextroamphetamine (ADDERALL) 30 MG tablet Take 2.5 tablets by mouth daily as needed. For energy 75 tablet 0  . diazepam (VALIUM) 5 MG tablet Take 1 tablet (5 mg total) by mouth daily as needed. 30 tablet 2  . DULoxetine (CYMBALTA) 30 MG capsule TAKE 1 CAPSULE(30 MG) BY MOUTH DAILY 30 capsule 1  . gabapentin (NEURONTIN) 600 MG tablet TAKE 1 TABLET(600 MG) BY MOUTH TWICE DAILY 60 tablet 2  . JUNEL FE 1/20 1-20 MG-MCG tablet Take 1 tablet by mouth daily.  0  . ketoconazole (NIZORAL) 2 % cream Apply 1 application topically daily.  2  . amphetamine-dextroamphetamine (ADDERALL XR) 30 MG 24 hr capsule Take 1 capsule (30 mg total) by mouth daily. 30 capsule 0  . ARIPiprazole (ABILIFY) 2 MG tablet TAKE 1 TABLET BY MOUTH EVERY DAY (Patient not taking: Reported on 02/27/2019) 30 tablet 0  . DULoxetine (CYMBALTA) 60 MG capsule Take 1 capsule (60 mg total) by mouth daily. 30 capsule 2  . omeprazole (PRILOSEC) 20 MG capsule TK 1 C PO BID    . PARoxetine (PAXIL) 10 MG tablet TAKE 1 TABLET(10 MG) BY MOUTH DAILY (Patient not taking: Reported on 02/27/2019) 30 tablet 0  . rizatriptan (MAXALT-MLT) 10 MG disintegrating tablet Take 10 mg by mouth as needed for migraine.   0   No current facility-administered medications for this visit.     Medication Side Effects: None  Allergies: No Known Allergies  No past medical history on file.  No family history on file.  Social History  Socioeconomic History  . Marital status: Married    Spouse name: Not on file  . Number of children: Not on file  . Years of education: Not on file  . Highest education level: Not on file  Occupational History  . Not on file  Social Needs  . Financial resource strain: Not on file  . Food insecurity    Worry: Not on file    Inability: Not on file  . Transportation needs    Medical: Not on file    Non-medical: Not on file  Tobacco Use  . Smoking status: Former Games developermoker  . Smokeless tobacco: Never Used   Substance and Sexual Activity  . Alcohol use: Never    Frequency: Never  . Drug use: Never  . Sexual activity: Not on file  Lifestyle  . Physical activity    Days per week: Not on file    Minutes per session: Not on file  . Stress: Not on file  Relationships  . Social Musicianconnections    Talks on phone: Not on file    Gets together: Not on file    Attends religious service: Not on file    Active member of club or organization: Not on file    Attends meetings of clubs or organizations: Not on file    Relationship status: Not on file  . Intimate partner violence    Fear of current or ex partner: Not on file    Emotionally abused: Not on file    Physically abused: Not on file    Forced sexual activity: Not on file  Other Topics Concern  . Not on file  Social History Narrative  . Not on file    Past Medical History, Surgical history, Social history, and Family history were reviewed and updated as appropriate.   Please see review of systems for further details on the patient's review from today.   Objective:   Physical Exam:  Ht 5\' 7"  (1.702 m)   Wt 127 lb (57.6 kg)   BMI 19.89 kg/m   Physical Exam Constitutional:      General: She is not in acute distress.    Appearance: She is well-developed.  Musculoskeletal:        General: No deformity.  Neurological:     Mental Status: She is alert and oriented to person, place, and time.     Coordination: Coordination normal.  Psychiatric:        Attention and Perception: Attention and perception normal. She does not perceive auditory or visual hallucinations.        Mood and Affect: Mood normal. Mood is not anxious or depressed. Affect is not labile, blunt, angry or inappropriate.        Speech: Speech normal.        Behavior: Behavior normal.        Thought Content: Thought content normal. Thought content is not paranoid or delusional. Thought content does not include homicidal or suicidal ideation. Thought content does not  include homicidal or suicidal plan.        Cognition and Memory: Cognition and memory normal.        Judgment: Judgment normal.     Comments: Insight intact     Lab Review:     Component Value Date/Time   NA 141 04/05/2018 1341   K 3.5 04/05/2018 1341   CL 104 04/05/2018 1341   CO2 27 04/05/2018 1341   GLUCOSE 92 04/05/2018 1341   BUN 10 04/05/2018 1341  CREATININE 0.65 04/05/2018 1341   CALCIUM 8.9 04/05/2018 1341   PROT 6.5 03/22/2018 1222   ALBUMIN 3.6 03/22/2018 1222   AST 19 03/22/2018 1222   ALT 14 03/22/2018 1222   ALKPHOS 73 03/22/2018 1222   BILITOT 0.6 03/22/2018 1222   GFRNONAA >60 04/05/2018 1341   GFRAA >60 04/05/2018 1341       Component Value Date/Time   WBC 7.7 04/05/2018 1341   RBC 4.34 04/05/2018 1341   HGB 13.5 04/05/2018 1341   HCT 40.0 04/05/2018 1341   PLT 357 04/05/2018 1341   MCV 92.2 04/05/2018 1341   MCH 31.1 04/05/2018 1341   MCHC 33.8 04/05/2018 1341   RDW 13.5 04/05/2018 1341   LYMPHSABS 1.5 03/22/2018 1222   MONOABS 0.4 03/22/2018 1222   EOSABS 0.1 03/22/2018 1222   BASOSABS 0.0 03/22/2018 1222    No results found for: POCLITH, LITHIUM   No results found for: PHENYTOIN, PHENOBARB, VALPROATE, CBMZ   .res Assessment: Plan:    Plan:  1. D/C Adderall 30mg  - 1 and 1/2 tabs daily 2. Add Adderall XR 30mg  daily - will call for a refill if working well 3. Increase Cymbalta 30mg  to 60mg   4. Continue Valium 5mg  prn anxiety 5. Continue Gabapentin 600mg  BID  Patient advised to contact office with any questions, adverse effects, or acute worsening in signs and symptoms.  Discussed potential benefits, risk, and side effects of benzodiazepines to include potential risk of tolerance and dependence, as well as possible drowsiness.  Advised patient not to drive if experiencing drowsiness and to take lowest possible effective dose to minimize risk of dependence and tolerance.  Discussed potential benefits, risks, and side effects of  stimulants with patient to include increased heart rate, palpitations, insomnia, increased anxiety, increased irritability, or decreased appetite.  Instructed patient to contact office if experiencing any significant tolerability issues.  RTC 4 weeks  Larri was seen today for depression, anxiety, adhd and insomnia.  Diagnoses and all orders for this visit:  Major depressive disorder, recurrent episode, moderate (HCC) -     DULoxetine (CYMBALTA) 60 MG capsule; Take 1 capsule (60 mg total) by mouth daily.  Attention deficit hyperactivity disorder (ADHD), combined type -     amphetamine-dextroamphetamine (ADDERALL XR) 30 MG 24 hr capsule; Take 1 capsule (30 mg total) by mouth daily. -     DULoxetine (CYMBALTA) 60 MG capsule; Take 1 capsule (60 mg total) by mouth daily.  Generalized anxiety disorder -     diazepam (VALIUM) 5 MG tablet; Take 1 tablet (5 mg total) by mouth daily as needed. -     gabapentin (NEURONTIN) 600 MG tablet; TAKE 1 TABLET(600 MG) BY MOUTH TWICE DAILY     Please see After Visit Summary for patient specific instructions.  No future appointments.  No orders of the defined types were placed in this encounter.   -------------------------------

## 2019-03-14 ENCOUNTER — Telehealth: Payer: Self-pay | Admitting: Adult Health

## 2019-03-14 NOTE — Telephone Encounter (Signed)
Pt called to ask if you would be willing to complete a letter for her job about her anxiety. Often times she will get ask to go to a different location other than [employer] to work and the last minute ([address]) She gets very high anxiety when this happens and would like letter to read she should only work at [employer] location due to her condition.

## 2019-03-22 ENCOUNTER — Telehealth: Payer: Self-pay | Admitting: Adult Health

## 2019-03-22 NOTE — Telephone Encounter (Signed)
Also we just increased her cymbalta and switched up Adderall.

## 2019-03-22 NOTE — Telephone Encounter (Signed)
Kathryn Henry is only doing phone visits and [employer] does not cover that . So she will have to wait till Monday for in office

## 2019-03-22 NOTE — Telephone Encounter (Signed)
Pt called stated she is very depressed with increased anxiety. Sleeping and crying a lot. Stated she is not suicidal, but need some relief. Appt was moved up to 9/14 instead of 10/12. Please call.

## 2019-03-27 ENCOUNTER — Other Ambulatory Visit: Payer: Self-pay

## 2019-03-27 ENCOUNTER — Ambulatory Visit (INDEPENDENT_AMBULATORY_CARE_PROVIDER_SITE_OTHER): Admitting: Adult Health

## 2019-03-27 ENCOUNTER — Encounter: Payer: Self-pay | Admitting: Adult Health

## 2019-03-27 DIAGNOSIS — F902 Attention-deficit hyperactivity disorder, combined type: Secondary | ICD-10-CM

## 2019-03-27 DIAGNOSIS — F411 Generalized anxiety disorder: Secondary | ICD-10-CM | POA: Diagnosis not present

## 2019-03-27 DIAGNOSIS — F0630 Mood disorder due to known physiological condition, unspecified: Secondary | ICD-10-CM

## 2019-03-27 DIAGNOSIS — F909 Attention-deficit hyperactivity disorder, unspecified type: Secondary | ICD-10-CM | POA: Diagnosis not present

## 2019-03-27 DIAGNOSIS — F331 Major depressive disorder, recurrent, moderate: Secondary | ICD-10-CM

## 2019-03-27 MED ORDER — ARIPIPRAZOLE 5 MG PO TABS: ORAL_TABLET | ORAL | 2 refills | Status: DC

## 2019-03-27 MED ORDER — DULOXETINE HCL 30 MG PO CPEP: 30.0000 mg | ORAL_CAPSULE | Freq: Every day | ORAL | 2 refills | Status: DC

## 2019-03-27 MED ORDER — AMPHETAMINE-DEXTROAMPHET ER 30 MG PO CP24: 30.0000 mg | ORAL_CAPSULE | Freq: Two times a day (BID) | ORAL | 0 refills | Status: DC

## 2019-03-27 NOTE — Progress Notes (Signed)
Kathryn Henry Ferner [id number] 07/12/1986 33 y.o.  Subjective:   Patient ID:  Kathryn Henry Gotschall is a 33 y.o. (DOB 07/12/1986) female.  Chief Complaint:  Chief Complaint  Patient presents with  . Depression  . Anxiety  . ADHD  . Insomnia    HPI Kathryn Henry Colello presents to the office today for follow-up of depression, anxiety, ADHD, and insomnia.   Describes mood today as "ok". Pleasant. Flat. Mood symptoms - denies depression, anxiety, and irritability. Feels "numb" - "not happy or sad". Has been feeling down since last visit. Had a prescription of Abilify of 2mg  tablet and started taking it 4 days ago. Feeling "better". Did some things around the house yesterday. Has felt worse since medication changes. Doesn't like to go home after work. Does have a dog at home. Daughter returns at the end of October - with father for the summer. Stating "I miss her". Decreased interest and motivation Taking medications as prescribed.  Energy levels "improved". Has ben taking Adderall XR 30mg  BID and feels it works well. Did not take over the weekend.  Enjoys some usual interests and activities. Spending time with family. Has a 33 year old daughter - talking with her on the phone. Mostly staying home outside of work.  Appetite adequate. Weight stable.  Sleeps well most nights. Averages 8 to 9 hours most nights - reports a few restless nights over the weekend. Focus and concentration stable. Completing tasks. Managing aspects of household. Work going well. Was diagnosed with ADHD in  2015.  Denies SI or HI. Denies AH or VH.   Review of Systems:  Review of Systems  Musculoskeletal: Negative for gait problem.  Neurological: Negative for tremors.  Psychiatric/Behavioral:       Please refer to HPI    Medications: I have reviewed the patient's current medications.  Current Outpatient Medications  Medication Sig Dispense Refill  . Adapalene 0.3 % gel Apply 1 application topically at bedtime.   1  .  amitriptyline (ELAVIL) 10 MG tablet Take 20 mg by mouth at bedtime as needed.    Marland Kitchen. amphetamine-dextroamphetamine (ADDERALL XR) 30 MG 24 hr capsule Take 1 capsule (30 mg total) by mouth 2 (two) times daily. 60 capsule 0  . amphetamine-dextroamphetamine (ADDERALL) 30 MG tablet Take 2.5 tablets by mouth daily as needed. For energy 75 tablet 0  . ARIPiprazole (ABILIFY) 5 MG tablet TAKE 1 TABLET BY MOUTH EVERY DAY 30 tablet 2  . diazepam (VALIUM) 5 MG tablet Take 1 tablet (5 mg total) by mouth daily as needed. 30 tablet 2  . DULoxetine (CYMBALTA) 30 MG capsule Take 1 capsule (30 mg total) by mouth daily. 30 capsule 2  . JUNEL FE 1/20 1-20 MG-MCG tablet Take 1 tablet by mouth daily.  0  . ketoconazole (NIZORAL) 2 % cream Apply 1 application topically daily.  2  . omeprazole (PRILOSEC) 20 MG capsule TK 1 C PO BID    . PARoxetine (PAXIL) 10 MG tablet TAKE 1 TABLET(10 MG) BY MOUTH DAILY (Patient not taking: Reported on 02/27/2019) 30 tablet 0  . rizatriptan (MAXALT-MLT) 10 MG disintegrating tablet Take 10 mg by mouth as needed for migraine.   0   No current facility-administered medications for this visit.     Medication Side Effects: None  Allergies: No Known Allergies  No past medical history on file.  No family history on file.  Social History   Socioeconomic History  . Marital status: Married    Spouse name: Not on file  .  Number of children: Not on file  . Years of education: Not on file  . Highest education level: Not on file  Occupational History  . Not on file  Social Needs  . Financial resource strain: Not on file  . Food insecurity    Worry: Not on file    Inability: Not on file  . Transportation needs    Medical: Not on file    Non-medical: Not on file  Tobacco Use  . Smoking status: Former Research scientist (life sciences)  . Smokeless tobacco: Never Used  Substance and Sexual Activity  . Alcohol use: Never    Frequency: Never  . Drug use: Never  . Sexual activity: Not on file  Lifestyle  .  Physical activity    Days per week: Not on file    Minutes per session: Not on file  . Stress: Not on file  Relationships  . Social Herbalist on phone: Not on file    Gets together: Not on file    Attends religious service: Not on file    Active member of club or organization: Not on file    Attends meetings of clubs or organizations: Not on file    Relationship status: Not on file  . Intimate partner violence    Fear of current or ex partner: Not on file    Emotionally abused: Not on file    Physically abused: Not on file    Forced sexual activity: Not on file  Other Topics Concern  . Not on file  Social History Narrative  . Not on file    Past Medical History, Surgical history, Social history, and Family history were reviewed and updated as appropriate.   Please see review of systems for further details on the patient's review from today.   Objective:   Physical Exam:  There were no vitals taken for this visit.  Physical Exam Constitutional:      General: She is not in acute distress.    Appearance: She is well-developed.  Musculoskeletal:        General: No deformity.  Neurological:     Mental Status: She is alert and oriented to person, place, and time.     Coordination: Coordination normal.  Psychiatric:        Attention and Perception: Attention and perception normal. She does not perceive auditory or visual hallucinations.        Mood and Affect: Mood normal. Affect is flat.        Speech: Speech normal.        Behavior: Behavior normal.        Thought Content: Thought content normal. Thought content is not paranoid or delusional. Thought content does not include homicidal or suicidal ideation. Thought content does not include homicidal or suicidal plan.        Cognition and Memory: Cognition and memory normal.        Judgment: Judgment normal.     Comments: Insight intact    Lab Review:     Component Value Date/Time   NA 141 04/05/2018 1341    K 3.5 04/05/2018 1341   CL 104 04/05/2018 1341   CO2 27 04/05/2018 1341   GLUCOSE 92 04/05/2018 1341   BUN 10 04/05/2018 1341   CREATININE 0.65 04/05/2018 1341   CALCIUM 8.9 04/05/2018 1341   PROT 6.5 03/22/2018 1222   ALBUMIN 3.6 03/22/2018 1222   AST 19 03/22/2018 1222   ALT 14 03/22/2018 1222   ALKPHOS  73 03/22/2018 1222   BILITOT 0.6 03/22/2018 1222   GFRNONAA >60 04/05/2018 1341   GFRAA >60 04/05/2018 1341       Component Value Date/Time   WBC 7.7 04/05/2018 1341   RBC 4.34 04/05/2018 1341   HGB 13.5 04/05/2018 1341   HCT 40.0 04/05/2018 1341   PLT 357 04/05/2018 1341   MCV 92.2 04/05/2018 1341   MCH 31.1 04/05/2018 1341   MCHC 33.8 04/05/2018 1341   RDW 13.5 04/05/2018 1341   LYMPHSABS 1.5 03/22/2018 1222   MONOABS 0.4 03/22/2018 1222   EOSABS 0.1 03/22/2018 1222   BASOSABS 0.0 03/22/2018 1222    No results found for: POCLITH, LITHIUM   No results found for: PHENYTOIN, PHENOBARB, VALPROATE, CBMZ   .res Assessment: Plan:    Plan: Increase Adderall XR 30mg  daily to BID  Decrease Cymbalta 30mg  from 60mg   Continue Valium 5mg  prn anxiety Continue Gabapentin 600mg  BID Increase Abilify 2mg  to 5mg    Plan to see a therapist   Patient advised to contact office with any questions, adverse effects, or acute worsening in signs and symptoms.  Discussed potential benefits, risk, and side effects of benzodiazepines to include potential risk of tolerance and dependence, as well as possible drowsiness.  Advised patient not to drive if experiencing drowsiness and to take lowest possible effective dose to minimize risk of dependence and tolerance.  Discussed potential benefits, risks, and side effects of stimulants with patient to include increased heart rate, palpitations, insomnia, increased anxiety, increased irritability, or decreased appetite.  Instructed patient to contact office if experiencing any significant tolerability issues.  RTC 3 weeks  Kathryn Henry was seen  today for depression, anxiety, adhd and insomnia.  Diagnoses and all orders for this visit:  Generalized anxiety disorder -     DULoxetine (CYMBALTA) 30 MG capsule; Take 1 capsule (30 mg total) by mouth daily.  Attention deficit hyperactivity disorder (ADHD), unspecified ADHD type  Major depressive disorder, recurrent episode, moderate (HCC) -     DULoxetine (CYMBALTA) 30 MG capsule; Take 1 capsule (30 mg total) by mouth daily.  Mood disorder in conditions classified elsewhere -     ARIPiprazole (ABILIFY) 5 MG tablet; TAKE 1 TABLET BY MOUTH EVERY DAY -     DULoxetine (CYMBALTA) 30 MG capsule; Take 1 capsule (30 mg total) by mouth daily.  Attention deficit hyperactivity disorder (ADHD), combined type -     amphetamine-dextroamphetamine (ADDERALL XR) 30 MG 24 hr capsule; Take 1 capsule (30 mg total) by mouth 2 (two) times daily.     Please see After Visit Summary for patient specific instructions.  Future Appointments  Date Time Provider Department Center  04/17/2019  9:00 AM Tredarius Cobern, Thereasa Solo, NP [facility] None    No orders of the defined types were placed in this encounter.   -------------------------------

## 2019-04-12 ENCOUNTER — Ambulatory Visit (INDEPENDENT_AMBULATORY_CARE_PROVIDER_SITE_OTHER): Admitting: Psychiatry

## 2019-04-12 ENCOUNTER — Other Ambulatory Visit: Payer: Self-pay

## 2019-04-12 DIAGNOSIS — F411 Generalized anxiety disorder: Secondary | ICD-10-CM
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
## 2019-04-17 ENCOUNTER — Ambulatory Visit (INDEPENDENT_AMBULATORY_CARE_PROVIDER_SITE_OTHER): Admitting: Adult Health

## 2019-04-17 ENCOUNTER — Encounter: Payer: Self-pay | Admitting: Adult Health

## 2019-04-17 ENCOUNTER — Other Ambulatory Visit: Payer: Self-pay

## 2019-04-17 DIAGNOSIS — F909 Attention-deficit hyperactivity disorder, unspecified type: Secondary | ICD-10-CM | POA: Diagnosis not present

## 2019-04-17 DIAGNOSIS — F411 Generalized anxiety disorder: Secondary | ICD-10-CM

## 2019-04-17 DIAGNOSIS — F331 Major depressive disorder, recurrent, moderate: Secondary | ICD-10-CM

## 2019-04-17 DIAGNOSIS — F0630 Mood disorder due to known physiological condition, unspecified: Secondary | ICD-10-CM

## 2019-04-17 DIAGNOSIS — F902 Attention-deficit hyperactivity disorder, combined type: Secondary | ICD-10-CM

## 2019-04-17 DIAGNOSIS — G4700 Insomnia, unspecified: Secondary | ICD-10-CM

## 2019-04-17 MED ORDER — BENZTROPINE MESYLATE 0.5 MG PO TABS: 0.5000 mg | ORAL_TABLET | Freq: Every day | ORAL | 2 refills | Status: DC

## 2019-04-17 MED ORDER — AMPHETAMINE-DEXTROAMPHET ER 30 MG PO CP24: 30.0000 mg | ORAL_CAPSULE | Freq: Two times a day (BID) | ORAL | 0 refills | Status: DC

## 2019-04-17 NOTE — Progress Notes (Signed)
Kathryn HeraldCynthia Henry [id number] October 23, 1985 33 y.o.  Subjective:   Patient ID:  Kathryn HeraldCynthia Henry is a 33 y.o. (DOB October 23, 1985) female.  Chief Complaint:  Chief Complaint  Patient presents with  . ADHD  . Anxiety  . Depression  . Sleeping Problem  . Medication Refill    HPI Kathryn HeraldCynthia Henry presents to the office today for follow-up of depression, anxiety, ADHD, and insomnia.   Describes mood today as "ok". Pleasant. Flat. Mood symptoms - denies depression, anxiety, and irritability. Stating "I feel like things are going better". Has not used any Valium since last visit - "anxiety much better". Also stating "I don't know how I feel, but I feel better". Having a difficult time getting "going" in the mornings. Taking "more effort". Feels like increasing the Adderall has "helped" with completing tasks. Reports restless legs and cramping in legs at night. Stable interest and motivation Taking medications as prescribed.  Energy levels stable. Active, does not have a regular exercise routine. Works 40 hours a week. Enjoys some usual interests and activities. Lives alone with dog Spending time with family. Has a 33 year old daughter - talking with her on the phone - will be home at the end of October - staying with father. Getting out more - did something both days this past weekend.  Appetite adequate. Weight stable.  Sleeps well most nights. Averages 9 hours most nights. Focus and concentration stable. Completing tasks. Managing aspects of household. Work going well. Has ben taking Adderall XR 30mg  BID and feels it works well.  Denies SI or HI. Denies AH or VH.   Review of Systems:  Review of Systems  Musculoskeletal: Negative for gait problem.  Neurological: Negative for tremors.  Psychiatric/Behavioral:       Please refer to HPI    Medications: I have reviewed the patient's current medications.  Current Outpatient Medications  Medication Sig Dispense Refill  . Adapalene 0.3 % gel Apply 1  application topically at bedtime.   1  . amitriptyline (ELAVIL) 10 MG tablet Take 20 mg by mouth at bedtime as needed.    Marland Kitchen. amphetamine-dextroamphetamine (ADDERALL XR) 30 MG 24 hr capsule Take 1 capsule (30 mg total) by mouth 2 (two) times daily. 60 capsule 0  . ARIPiprazole (ABILIFY) 5 MG tablet TAKE 1 TABLET BY MOUTH EVERY DAY 30 tablet 2  . benztropine (COGENTIN) 0.5 MG tablet Take 1 tablet (0.5 mg total) by mouth daily. 30 tablet 2  . diazepam (VALIUM) 5 MG tablet Take 1 tablet (5 mg total) by mouth daily as needed. 30 tablet 2  . drospirenone-ethinyl estradiol (YAZ) 3-0.02 MG tablet TK 1 T PO QD    . DULoxetine (CYMBALTA) 30 MG capsule Take 1 capsule (30 mg total) by mouth daily. 30 capsule 2  . JUNEL FE 1/20 1-20 MG-MCG tablet Take 1 tablet by mouth daily.  0  . ketoconazole (NIZORAL) 2 % cream Apply 1 application topically daily.  2  . omeprazole (PRILOSEC) 20 MG capsule TK 1 C PO BID    . rizatriptan (MAXALT-MLT) 10 MG disintegrating tablet Take 10 mg by mouth as needed for migraine.   0   No current facility-administered medications for this visit.     Medication Side Effects: Other: Restless legs - cramping at night.  Allergies: No Known Allergies  No past medical history on file.  No family history on file.  Social History   Socioeconomic History  . Marital status: Married    Spouse name: Not on file  .  Number of children: Not on file  . Years of education: Not on file  . Highest education level: Not on file  Occupational History  . Not on file  Social Needs  . Financial resource strain: Not on file  . Food insecurity    Worry: Not on file    Inability: Not on file  . Transportation needs    Medical: Not on file    Non-medical: Not on file  Tobacco Use  . Smoking status: Former Research scientist (life sciences)  . Smokeless tobacco: Never Used  Substance and Sexual Activity  . Alcohol use: Never    Frequency: Never  . Drug use: Never  . Sexual activity: Not on file  Lifestyle  .  Physical activity    Days per week: Not on file    Minutes per session: Not on file  . Stress: Not on file  Relationships  . Social Herbalist on phone: Not on file    Gets together: Not on file    Attends religious service: Not on file    Active member of club or organization: Not on file    Attends meetings of clubs or organizations: Not on file    Relationship status: Not on file  . Intimate partner violence    Fear of current or ex partner: Not on file    Emotionally abused: Not on file    Physically abused: Not on file    Forced sexual activity: Not on file  Other Topics Concern  . Not on file  Social History Narrative  . Not on file    Past Medical History, Surgical history, Social history, and Family history were reviewed and updated as appropriate.   Please see review of systems for further details on the patient's review from today.   Objective:   Physical Exam:  There were no vitals taken for this visit.  Physical Exam Constitutional:      General: She is not in acute distress.    Appearance: She is well-developed.  Musculoskeletal:        General: No deformity.  Neurological:     Mental Status: She is alert and oriented to person, place, and time.     Coordination: Coordination normal.  Psychiatric:        Attention and Perception: Attention and perception normal. She does not perceive auditory or visual hallucinations.        Mood and Affect: Mood normal. Mood is not anxious or depressed. Affect is flat. Affect is not labile, blunt, angry or inappropriate.        Speech: Speech normal.        Behavior: Behavior normal.        Thought Content: Thought content normal. Thought content is not paranoid or delusional. Thought content does not include homicidal or suicidal ideation. Thought content does not include homicidal or suicidal plan.        Cognition and Memory: Cognition and memory normal.        Judgment: Judgment normal.     Comments:  Insight intact    Lab Review:     Component Value Date/Time   NA 141 04/05/2018 1341   K 3.5 04/05/2018 1341   CL 104 04/05/2018 1341   CO2 27 04/05/2018 1341   GLUCOSE 92 04/05/2018 1341   BUN 10 04/05/2018 1341   CREATININE 0.65 04/05/2018 1341   CALCIUM 8.9 04/05/2018 1341   PROT 6.5 03/22/2018 1222   ALBUMIN 3.6 03/22/2018 1222  AST 19 03/22/2018 1222   ALT 14 03/22/2018 1222   ALKPHOS 73 03/22/2018 1222   BILITOT 0.6 03/22/2018 1222   GFRNONAA >60 04/05/2018 1341   GFRAA >60 04/05/2018 1341       Component Value Date/Time   WBC 7.7 04/05/2018 1341   RBC 4.34 04/05/2018 1341   HGB 13.5 04/05/2018 1341   HCT 40.0 04/05/2018 1341   PLT 357 04/05/2018 1341   MCV 92.2 04/05/2018 1341   MCH 31.1 04/05/2018 1341   MCHC 33.8 04/05/2018 1341   RDW 13.5 04/05/2018 1341   LYMPHSABS 1.5 03/22/2018 1222   MONOABS 0.4 03/22/2018 1222   EOSABS 0.1 03/22/2018 1222   BASOSABS 0.0 03/22/2018 1222    No results found for: POCLITH, LITHIUM   No results found for: PHENYTOIN, PHENOBARB, VALPROATE, CBMZ   .res Assessment: Plan:    Plan: IAdderall XR 30mg  daily to BID  Cymbalta 30mg  daily Continue Valium 5mg  prn anxiety Continue Gabapentin 600mg  BID Abilify 5mg  daily Add Cogentin 0.5mg  daily for restlessness   Plan to see a therapist   Patient advised to contact office with any questions, adverse effects, or acute worsening in signs and symptoms.  Discussed potential benefits, risk, and side effects of benzodiazepines to include potential risk of tolerance and dependence, as well as possible drowsiness.  Advised patient not to drive if experiencing drowsiness and to take lowest possible effective dose to minimize risk of dependence and tolerance.  Discussed potential benefits, risks, and side effects of stimulants with patient to include increased heart rate, palpitations, insomnia, increased anxiety, increased irritability, or decreased appetite.  Instructed patient to  contact office if experiencing any significant tolerability issues.  Discussed potential metabolic side effects associated with atypical antipsychotics, as well as potential risk for movement side effects. Advised pt to contact office if movement side effects occur.   RTC 4 weeks  Kathryn Henry was seen today for adhd, anxiety, depression, sleeping problem and medication refill.  Diagnoses and all orders for this visit:  Generalized anxiety disorder  Attention deficit hyperactivity disorder (ADHD), unspecified ADHD type  Major depressive disorder, recurrent episode, moderate (HCC)  Mood disorder in conditions classified elsewhere  Insomnia, unspecified type  Attention deficit hyperactivity disorder (ADHD), combined type -     amphetamine-dextroamphetamine (ADDERALL XR) 30 MG 24 hr capsule; Take 1 capsule (30 mg total) by mouth 2 (two) times daily.  Other orders -     benztropine (COGENTIN) 0.5 MG tablet; Take 1 tablet (0.5 mg total) by mouth daily.     Please see After Visit Summary for patient specific instructions.  Future Appointments  Date Time Provider Department Center  05/01/2019  8:00 AM , LCSW [facility] None  05/15/2019  8:20 AM Aidel Davisson, , NP [facility] None  05/15/2019  9:00 AM 05/03/2019, LCSW [facility] None  05/29/2019  9:00 AM 13/08/2018, LCSW [facility] None    No orders of the defined types were placed in this encounter.   -------------------------------

## 2019-04-24 ENCOUNTER — Ambulatory Visit: Admitting: Adult Health

## 2019-04-27 ENCOUNTER — Telehealth: Payer: Self-pay | Admitting: Adult Health

## 2019-04-27 NOTE — Telephone Encounter (Signed)
Pt called to report can't stay a wake taking Abilify. Late to work all week. Fell a sleep cooking dinner. Need something else. Call back at work [phone] after 5pm her cell [phone]

## 2019-04-28 NOTE — Telephone Encounter (Signed)
Left voice mail to call back 

## 2019-05-01 ENCOUNTER — Other Ambulatory Visit: Payer: Self-pay

## 2019-05-01 ENCOUNTER — Ambulatory Visit (INDEPENDENT_AMBULATORY_CARE_PROVIDER_SITE_OTHER): Admitting: Psychiatry

## 2019-05-01 DIAGNOSIS — F411 Generalized anxiety disorder: Secondary | ICD-10-CM | POA: Diagnosis not present

## 2019-05-01 NOTE — Progress Notes (Signed)
[facility] Counselor/Therapist Progress Note  Patient ID: Kathryn Henry, MRN: [id number],    Date: 05/01/2019  Time Spent: 60 minutes   8:00am to 9:00am  Treatment Type: Individual Therapy  Reported Symptoms: anxiety, depression, some sadness "generalized"  Mental Status Exam:  Appearance:   Casual     Behavior:  Appropriate and Sharing  Motor:  Normal  Speech/Language:   Normal Rate  Affect:  anxious  Mood:  anxious and depressed  Thought process:  goal directed  Thought content:    WNL  Sensory/Perceptual disturbances:    WNL  Orientation:  oriented to person, place, time/date, situation, day of week and month of year  Attention:  some difficulty concentrating from what she feels may be medication related.  Is checking with med provider here today.  Concentration:  Fair  Memory:  WNL  Fund of knowledge:   some forgetfulness that she feels is med-related. Is checking with med provider today while here.  Insight:    Good  Judgment:   Good  Impulse Control:  Good   Risk Assessment: Danger to Self:  No Self-injurious Behavior: No Danger to Others: No Duty to Warn:no Physical Aggression / Violence:No  Access to Firearms a concern: No  Gang Involvement:No   Subjective:  Patient in today and reports some anxiety and also some depression today. Adds that she tends to do better during the week and is now more depressed on weekends. Hard to try anything different to manage the depression, and that it is not getting worse, but also not getting better. Talked about anything that helps the depression--states she does visit her sister and sister's family some on weekends and stays a couple hours and that helps until I go back home where patient feels very alone. States she works in environment where there is not people available that would be good friend choices.  Says while she would like to have a friend, it's more important not to feel so sad.  States that she has just  gotten throught a weekend and weekends are always more depressing than the weeks.  Interventions: Cognitive Behavioral Therapy and Ego-Supportive  Diagnosis:   ICD-10-CM   1. Generalized anxiety disorder  F41.1      Plan: Patient not signing tx plan on computer screen due to COVID.  Treatment Goals: Goals remain on tx plan as patient works on strategies to meet her goals.  Progress will be documented each session in the "Progress" section .  Long term goal: Develop healthy interpersonal relationships that help lead to alleviation of depression and prevent relapse.  Short term goal: Identify and replace depressive thinking that leads to depressive feelings and actions.  Strategy: Educate patient about cognitive restructuring including self-monitoring of automatic thoughts that support depression and/or anxiety.  Will work to stop/interrupt the negative automatic thoughts and replace the with more positive, self-affirming thoughts.  Progress:   Patient has not made much progress yet on goals.  We worked on recognizing depressive and negative thoughts recently including "nobody really wants to hang out with me, I can't do anything right, I don't even have the energy to get off sofa to do chores."  Focused with patient in session on catching her negative/depressive thoughts and being able to interrupt them and talk back to them, turning them in to more positive, reality-based thoughts.  Practiced this in session today and she shares that she had not thought of her thinking so negatively as being part of what  blocks her.  Seems committed to homework of monitoring thoughts, catching the negative/depressive/anxious ones and interrupting them, then replacing them with more positive, reality-based thoughts that do not lead to depression/anxiety.  She is to keep homework notes on her phone and share next session.  Showing more trust in talking today. Denies any SI. Goal review and progress noted  with patient.   Next session within 2 weeks.   Shanon Ace, LCSW

## 2019-05-02 MED ORDER — LATUDA 20 MG PO TABS: ORAL_TABLET | ORAL | 2 refills | Status: DC

## 2019-05-02 NOTE — Addendum Note (Signed)
Addended by: Aloha Gell on: 05/02/2019 03:52 PM   Modules accepted: Orders

## 2019-05-15 ENCOUNTER — Other Ambulatory Visit: Payer: Self-pay

## 2019-05-15 ENCOUNTER — Encounter: Payer: Self-pay | Admitting: Adult Health

## 2019-05-15 ENCOUNTER — Ambulatory Visit (INDEPENDENT_AMBULATORY_CARE_PROVIDER_SITE_OTHER): Admitting: Adult Health

## 2019-05-15 ENCOUNTER — Ambulatory Visit (INDEPENDENT_AMBULATORY_CARE_PROVIDER_SITE_OTHER): Admitting: Psychiatry

## 2019-05-15 DIAGNOSIS — F411 Generalized anxiety disorder: Secondary | ICD-10-CM

## 2019-05-15 DIAGNOSIS — F429 Obsessive-compulsive disorder, unspecified: Secondary | ICD-10-CM

## 2019-05-15 DIAGNOSIS — F331 Major depressive disorder, recurrent, moderate: Secondary | ICD-10-CM | POA: Diagnosis not present

## 2019-05-15 DIAGNOSIS — F902 Attention-deficit hyperactivity disorder, combined type: Secondary | ICD-10-CM | POA: Diagnosis not present

## 2019-05-15 DIAGNOSIS — G4700 Insomnia, unspecified: Secondary | ICD-10-CM | POA: Diagnosis not present

## 2019-05-15 MED ORDER — AMPHETAMINE-DEXTROAMPHET ER 30 MG PO CP24: 30.0000 mg | ORAL_CAPSULE | Freq: Two times a day (BID) | ORAL | 0 refills | Status: DC

## 2019-05-15 MED ORDER — BREXPIPRAZOLE 1 MG PO TABS: 1.0000 mg | ORAL_TABLET | Freq: Every day | ORAL | 0 refills | Status: DC

## 2019-05-15 NOTE — Progress Notes (Signed)
[facility] Counselor/Therapist Progress Note  Patient ID: Kathryn Henry, MRN: [id number],    Date: 05/15/2019  Time Spent: 60 minutes  9:00a to 10:00am  Treatment Type: Individual Therapy  Reported Symptoms: depression, anxiety (may be lessening some but has been some up and down)  Mental Status Exam:  Appearance:   Casual     Behavior:  Appropriate and Sharing  Motor:  Normal  Speech/Language:   Normal Rate  Affect:  Depressed and anxious  Mood:  anxious and depressed  Thought process:  normal  Thought content:    WNL  Sensory/Perceptual disturbances:    WNL  Orientation:  oriented to person, place, time/date, situation, day of week, month of year and year  Attention:  Good  Concentration:  Good  Memory:  WNL  Fund of knowledge:   Good  Insight:    Good  Judgment:   Good  Impulse Control:  Good   Risk Assessment: Danger to Self:  No Self-injurious Behavior: No Danger to Others: No Duty to Warn:no Physical Aggression / Violence:No  Access to Firearms a concern: No  Gang Involvement:No   Subjective:  Patient in today reporting depression and some anxiety.  States past couple of weeks have not been good because a work situation in needing a day and a half off due to migraine.  Pt felt they were not speaking to her on her return to work and that has been both hurtful and contributed to some anger.  Interventions: Cognitive Behavioral Therapy and Solution-Oriented/Positive Psychology  Diagnosis:   ICD-10-CM   1. Generalized anxiety disorder  F41.1      Plan: Patient not signing tx plan on computer screen due to COVID.  Treatment Goals: Goals remain on tx plan as patient works on strategies to meet her goals.Progress will be documented each session in the "Progress" section.  Long term goal: Develop healthy interpersonal relationships that help lead to alleviation of depression and anxiety, and prevent relapse.  Short term goal: Identify and  replace depressive thinking that leads to depressive/anxious feelings and actions.  Strategy: Educate patient about cognitive restructuring including self-monitoring of automatic thoughts that support depression and/or anxiety. Will work to stop/interrupt the negative automatic thoughts and replace the with more positive, self-affirming thoughts.  Progress: Patient having a difficult time making progress on goals. She feels depressed, anxious  and states that she has no friends. States "I dont' want friends, but I don't want to be alone." Admits this attitude feeds her depression.  Avoids social medial as people talk about how happy they are and I'm just the opposite.. Does have friends where she moved from in [place] but feels she can't move back as her daughter wouldn't be able to visit her dad as much, as he is in process of moving to [place] from [place] state.  Is considering moving to apt complex where her sister lives in [place]. Plans to talk with sister this week about that possibility.  Worked today on her thoughts that tend to lead to anxiety and depression.  She was able to share several recent thoughts which for her comes in the form of questions.  She did her homework from last session and had the documentation on her phone and shared it.  Patient was able to catch some of her negative/anxious thoughts but having difficulty with the next step of replacing them with more reality-based, positive thoughts.  Examples were provided in session of how patient could replace her anxiety  and depressive thoughts and talked about what may her in doing that.  Affect seemed a bit more bright at end of session..  Denies any SI. To continue with homework and will pick up next session.  Review of goals and progress noted with patient.   Next session within 2-3 weeks.   Shanon Ace, LCSW

## 2019-05-15 NOTE — Progress Notes (Signed)
Kathryn Henry [id number] October 17, 1985 33 y.o.  Subjective:   Patient ID:  Kathryn Henry is a 33 y.o. (DOB 1985/10/22) female.  Chief Complaint:  No chief complaint on file.   HPI Priyana Mccarey presents to the office today for follow-up of depression, anxiety, ADHD, and insomnia.   Describes mood today as "ok". Pleasant. Flat. Mood symptoms - reports depression. Stating "that is my biggest issue". Having some anxiety and irritability. Has not used valium since last visit. Had to stop taking Abilify due to "sleepiness".  Feels "physically" better since stopping the Abilify. Denies restless legs - has stopped the Cogentin. Stable interest and motivation Taking medications as prescribed.  Energy levels "could be better". Active, does not have a regular exercise routine. Works 40 hours a week. Enjoys some usual interests and activities. Lives alone with dog. Talking with sister. Spending time with family. Has a 82 year old daughter - returning home this week.  Appetite adequate. Weight stable.  Sleeps well most nights. Averages 8 to 9 hours most nights. Focus and concentration stable. Completing tasks. Managing aspects of household. Work going well. Has ben taking Adderall XR 30mg  BID and feels it works well.  Denies SI or HI. Denies AH or VH.   Previous medications: Abilify, Celexa, Wellbutrin, Prozac   Review of Systems:  Review of Systems  Musculoskeletal: Negative for gait problem.  Neurological: Negative for tremors.  Psychiatric/Behavioral:       Please refer to HPI    Medications: I have reviewed the patient's current medications.  Current Outpatient Medications  Medication Sig Dispense Refill  . Adapalene 0.3 % gel Apply 1 application topically at bedtime.   1  . amitriptyline (ELAVIL) 10 MG tablet Take 20 mg by mouth at bedtime as needed.    Marland Kitchen amphetamine-dextroamphetamine (ADDERALL XR) 30 MG 24 hr capsule Take 1 capsule (30 mg total) by mouth 2 (two) times daily. 60  capsule 0  . brexpiprazole (REXULTI) 1 MG TABS tablet Take 1 tablet (1 mg total) by mouth daily. 30 tablet 0  . diazepam (VALIUM) 5 MG tablet Take 1 tablet (5 mg total) by mouth daily as needed. 30 tablet 2  . drospirenone-ethinyl estradiol (YAZ) 3-0.02 MG tablet TK 1 T PO QD    . DULoxetine (CYMBALTA) 30 MG capsule Take 1 capsule (30 mg total) by mouth daily. 30 capsule 2  . JUNEL FE 1/20 1-20 MG-MCG tablet Take 1 tablet by mouth daily.  0  . ketoconazole (NIZORAL) 2 % cream Apply 1 application topically daily.  2  . omeprazole (PRILOSEC) 20 MG capsule TK 1 C PO BID    . rizatriptan (MAXALT-MLT) 10 MG disintegrating tablet Take 10 mg by mouth as needed for migraine.   0   No current facility-administered medications for this visit.     Medication Side Effects: Other: Restless legs - cramping at night.  Allergies: No Known Allergies  No past medical history on file.  No family history on file.  Social History   Socioeconomic History  . Marital status: Married    Spouse name: Not on file  . Number of children: Not on file  . Years of education: Not on file  . Highest education level: Not on file  Occupational History  . Not on file  Social Needs  . Financial resource strain: Not on file  . Food insecurity    Worry: Not on file    Inability: Not on file  . Transportation needs    Medical: Not  on file    Non-medical: Not on file  Tobacco Use  . Smoking status: Former Games developer  . Smokeless tobacco: Never Used  Substance and Sexual Activity  . Alcohol use: Never    Frequency: Never  . Drug use: Never  . Sexual activity: Not on file  Lifestyle  . Physical activity    Days per week: Not on file    Minutes per session: Not on file  . Stress: Not on file  Relationships  . Social Musician on phone: Not on file    Gets together: Not on file    Attends religious service: Not on file    Active member of club or organization: Not on file    Attends meetings of  clubs or organizations: Not on file    Relationship status: Not on file  . Intimate partner violence    Fear of current or ex partner: Not on file    Emotionally abused: Not on file    Physically abused: Not on file    Forced sexual activity: Not on file  Other Topics Concern  . Not on file  Social History Narrative  . Not on file    Past Medical History, Surgical history, Social history, and Family history were reviewed and updated as appropriate.   Please see review of systems for further details on the patient's review from today.   Objective:   Physical Exam:  There were no vitals taken for this visit.  Physical Exam Constitutional:      General: She is not in acute distress.    Appearance: She is well-developed.  Musculoskeletal:        General: No deformity.  Neurological:     Mental Status: She is alert and oriented to person, place, and time.     Coordination: Coordination normal.  Psychiatric:        Attention and Perception: Attention and perception normal. She does not perceive auditory or visual hallucinations.        Mood and Affect: Mood normal. Mood is not anxious or depressed. Affect is flat. Affect is not labile, blunt, angry or inappropriate.        Speech: Speech normal.        Behavior: Behavior normal.        Thought Content: Thought content normal. Thought content is not paranoid or delusional. Thought content does not include homicidal or suicidal ideation. Thought content does not include homicidal or suicidal plan.        Cognition and Memory: Cognition and memory normal.        Judgment: Judgment normal.     Comments: Insight intact    Lab Review:     Component Value Date/Time   NA 141 04/05/2018 1341   K 3.5 04/05/2018 1341   CL 104 04/05/2018 1341   CO2 27 04/05/2018 1341   GLUCOSE 92 04/05/2018 1341   BUN 10 04/05/2018 1341   CREATININE 0.65 04/05/2018 1341   CALCIUM 8.9 04/05/2018 1341   PROT 6.5 03/22/2018 1222   ALBUMIN 3.6  03/22/2018 1222   AST 19 03/22/2018 1222   ALT 14 03/22/2018 1222   ALKPHOS 73 03/22/2018 1222   BILITOT 0.6 03/22/2018 1222   GFRNONAA >60 04/05/2018 1341   GFRAA >60 04/05/2018 1341       Component Value Date/Time   WBC 7.7 04/05/2018 1341   RBC 4.34 04/05/2018 1341   HGB 13.5 04/05/2018 1341   HCT 40.0 04/05/2018 1341  PLT 357 04/05/2018 1341   MCV 92.2 04/05/2018 1341   MCH 31.1 04/05/2018 1341   MCHC 33.8 04/05/2018 1341   RDW 13.5 04/05/2018 1341   LYMPHSABS 1.5 03/22/2018 1222   MONOABS 0.4 03/22/2018 1222   EOSABS 0.1 03/22/2018 1222   BASOSABS 0.0 03/22/2018 1222    No results found for: POCLITH, LITHIUM   No results found for: PHENYTOIN, PHENOBARB, VALPROATE, CBMZ   .res Assessment: Plan:    Plan: Adderall XR 30mg  daily to BID  Cymbalta 30mg  daily - has tried increasing to 60mg  - did not tolerate Add Rexulti 0.5mg  daily x 7 days, then 1 mg daily - samples given. Continue Valium 5mg  prn anxiety Continue Gabapentin 600mg  BID Stopped Abilify 5mg  daily Stopped Cogentin 0.5mg  daily for restlessness   Seeing Rockne MenghiniDebbie Dowd  Patient advised to contact office with any questions, adverse effects, or acute worsening in signs and symptoms.  Discussed potential benefits, risk, and side effects of benzodiazepines to include potential risk of tolerance and dependence, as well as possible drowsiness.  Advised patient not to drive if experiencing drowsiness and to take lowest possible effective dose to minimize risk of dependence and tolerance.  Discussed potential benefits, risks, and side effects of stimulants with patient to include increased heart rate, palpitations, insomnia, increased anxiety, increased irritability, or decreased appetite.  Instructed patient to contact office if experiencing any significant tolerability issues.  Discussed potential metabolic side effects associated with atypical antipsychotics, as well as potential risk for movement side effects.  Advised pt to contact office if movement side effects occur.   RTC 4 weeks  Diagnoses and all orders for this visit:  Generalized anxiety disorder  Attention deficit hyperactivity disorder (ADHD), combined type -     amphetamine-dextroamphetamine (ADDERALL XR) 30 MG 24 hr capsule; Take 1 capsule (30 mg total) by mouth 2 (two) times daily.  Major depressive disorder, recurrent episode, moderate (HCC) -     brexpiprazole (REXULTI) 1 MG TABS tablet; Take 1 tablet (1 mg total) by mouth daily.  Insomnia, unspecified type  Obsessive-compulsive disorder, unspecified type     Please see After Visit Summary for patient specific instructions.  Future Appointments  Date Time Provider Department Center  05/15/2019  9:00 AM Mathis Fareowd, Deborah, LCSW [facility] None  05/31/2019  8:00 AM Mathis Fareowd, Deborah, LCSW [facility] None  06/12/2019  9:00 AM Mathis Fareowd, Deborah, LCSW [facility] None  06/26/2019  9:00 AM Mathis Fareowd, Deborah, LCSW [facility] None  07/10/2019  9:00 AM Mathis Fareowd, Deborah, LCSW [facility] None    No orders of the defined types were placed in this encounter.   -------------------------------

## 2019-05-29 ENCOUNTER — Ambulatory Visit: Admitting: Psychiatry

## 2019-05-31 ENCOUNTER — Ambulatory Visit: Admitting: Psychiatry

## 2019-06-05 ENCOUNTER — Telehealth: Payer: Self-pay | Admitting: Adult Health

## 2019-06-05 ENCOUNTER — Other Ambulatory Visit: Payer: Self-pay

## 2019-06-05 MED ORDER — GABAPENTIN 600 MG PO TABS: 600.0000 mg | ORAL_TABLET | Freq: Two times a day (BID) | ORAL | 2 refills | Status: DC

## 2019-06-05 NOTE — Telephone Encounter (Signed)
Noted thank you

## 2019-06-05 NOTE — Telephone Encounter (Signed)
Pt call stated she had called earlier for her refill. Stated it is not at the [facility] location and she cant do without it. She also stated Rollene Fare told her to call her directly. I explained Rollene Fare is still seeing patients and I would forward the msg to her.

## 2019-06-05 NOTE — Telephone Encounter (Signed)
It was for the Gabapentin. I  let her know that we sent it in for her.

## 2019-06-12 ENCOUNTER — Other Ambulatory Visit: Payer: Self-pay

## 2019-06-12 ENCOUNTER — Ambulatory Visit: Admitting: Adult Health

## 2019-06-12 ENCOUNTER — Ambulatory Visit (INDEPENDENT_AMBULATORY_CARE_PROVIDER_SITE_OTHER): Admitting: Psychiatry

## 2019-06-12 DIAGNOSIS — F411 Generalized anxiety disorder: Secondary | ICD-10-CM

## 2019-06-12 NOTE — Progress Notes (Signed)
[facility] Counselor/Therapist Progress Note  Patient ID: Kathryn Henry, MRN: [id number],    Date: 06/12/2019  Time Spent: 60 minutes 9:00am to 10:00am  Treatment Type: Individual Therapy  Reported Symptoms: anxiety, exhausted that meds don't seem to be working (trying another one today-Latuda), tearful, oversleeping, some depression,denies any SI  Mental Status Exam:  Appearance:   Neat     Behavior:  Appropriate and Sharing  Motor:  Normal  Speech/Language:   Clear and Coherent  Affect:  anxious, some depression   Mood:  anxious, depressed and sad  Thought process:  goal directed  Thought content:    WNL  Sensory/Perceptual disturbances:    WNL  Orientation:  oriented to person, place, time/date, situation, day of week, month of year and year  Attention:  Good  Concentration:  Fair  Memory:  WNL  Fund of knowledge:   Good  Insight:    Good  Judgment:   Good  Impulse Control:  Good   Risk Assessment: Danger to Self:  No Self-injurious Behavior: No Danger to Others: No Duty to Warn:no Physical Aggression / Violence:No  Access to Firearms a concern: No  Gang Involvement:No   Subjective: Patient today feeling increased sadness, anxiety, depression "as we can't seem to find a medication that helps".  Oversleeping some.  Denies any SI. Doesn't talk to very many people and doesn't share much of her struggles with anyone.  Interventions: Cognitive Behavioral Therapy and Ego-Supportive  Diagnosis:   ICD-10-CM   1. Generalized anxiety disorder  F41.1     Plan: Patient not signing tx plan on computer screen due to [place].  Treatment Goals: Goals remain on tx plan as patient works on strategies to meet her goals.Progress will be documented each session in the "Progress" section.  Long term goal: Develop healthy interpersonal relationships that help lead to alleviation of depression and anxiety, and prevent relapse.  Short term goal: Identify and  replace depressive thinking that leads to depressive/anxious feelings and actions.  Strategy: Educate patient about cognitive restructuring including self-monitoring of automatic thoughts that support depression and/or anxiety. Will work to stop/interrupt the negative automatic thoughts and replace the with more positive, self-affirming thoughts.  Progress: Patient more anxious, depressed, and sad today as she is afraid "they may not find a medicine that will help me, as I've already tried another one, and got Latuda" from her med prescriber today to try. Worked hard on goals today, especially relating to her negative/depressive/anxious thought patterns. She has not made much progress on this yet.  Made several depressive statement in session which I challenged and pointed them out as coming from negative/depressive thoughts.  Patient actually seemed to get this and understand some of how her negative thoughts lead to depressive feelings and eventually depressive behaviors.  Continues to deny SI.  Talked about how she tends to close herself off from others and doesn't really have friends.  But is fairly close to sister who she visits some weekly.  Discussed how she might share more info with sister to have more support and she agreed to try this week. Will also follow up on assignment to self-monitor for depressive/negative/anxious thoughts and interrupt them, then replacing them with more positive, reality-based, empowering thoughts that do not lead to depression or anxiety.  Lot of effort shown by patient today and has a way to go moving forwards.  Goal review and progress/efforts noted with patient.   Next appt within 2 weeks.   Kathryn Ace,  LCSW

## 2019-06-19 ENCOUNTER — Encounter: Payer: Self-pay | Admitting: Adult Health

## 2019-06-19 ENCOUNTER — Ambulatory Visit (INDEPENDENT_AMBULATORY_CARE_PROVIDER_SITE_OTHER): Admitting: Adult Health

## 2019-06-19 ENCOUNTER — Other Ambulatory Visit: Payer: Self-pay

## 2019-06-19 DIAGNOSIS — F411 Generalized anxiety disorder: Secondary | ICD-10-CM | POA: Diagnosis not present

## 2019-06-19 DIAGNOSIS — F331 Major depressive disorder, recurrent, moderate: Secondary | ICD-10-CM

## 2019-06-19 DIAGNOSIS — G4700 Insomnia, unspecified: Secondary | ICD-10-CM | POA: Diagnosis not present

## 2019-06-19 DIAGNOSIS — F429 Obsessive-compulsive disorder, unspecified: Secondary | ICD-10-CM

## 2019-06-19 DIAGNOSIS — F902 Attention-deficit hyperactivity disorder, combined type: Secondary | ICD-10-CM

## 2019-06-19 MED ORDER — GABAPENTIN 600 MG PO TABS: 600.0000 mg | ORAL_TABLET | Freq: Three times a day (TID) | ORAL | 5 refills | Status: DC

## 2019-06-19 MED ORDER — AMPHETAMINE-DEXTROAMPHET ER 30 MG PO CP24: 30.0000 mg | ORAL_CAPSULE | Freq: Two times a day (BID) | ORAL | 0 refills | Status: DC

## 2019-06-19 NOTE — Progress Notes (Signed)
Kathryn Henry [id number] [birth year] 33 y.o.  Subjective:   Patient ID:  Kathryn Henry is a 33 y.o. (DOB 07-04-1986) female.  Chief Complaint:  No chief complaint on file.   HPI Kathryn Henry presents to the office today for follow-up of depression, anxiety, ADHD, and insomnia.   Describes mood today as "ok". Pleasant. Flat. Mood symptoms - reports depression. Denies anxiety and irritability. Feels like Gabapentin keeps her calm and helps her mood - would like to increase it. Wanting to stop the Cymbalta - "I don't feel like it's doing anything for me". Did not tolerate Latuda. Forgetful at work. Caused panic attacks. Started lactating. Felt like Rexulti worked better at a lower dose and would like to restart it at 0.5mg  daily. Stable interest and motivation Taking medications as prescribed.  Energy levels low. Active, does not have a regular exercise routine. Works 40 hours a week. Enjoys some usual interests and activities. Lives alone with dog. 25 year old daughter lives with her father. Talking with sister. Spending time with family. Appetite decreased with Latuda. Weight stable. Sleeps well most nights. Averages 8 to 9 hours most nights. Focus and concentration stable. Completing tasks. Managing aspects of household. Work going well. Has ben taking Adderall XR 30mg  BID.  Denies SI or HI. Denies AH or VH.   Previous medications: Abilify, Celexa, Wellbutrin, Prozac, Latuda, Rexulti, Cogentin  Review of Systems:  Review of Systems  Musculoskeletal: Negative for gait problem.  Neurological: Negative for tremors.  Psychiatric/Behavioral: Negative for sleep disturbance.       Please refer to HPI    Medications: I have reviewed the patient's current medications.  Current Outpatient Medications  Medication Sig Dispense Refill  . Adapalene 0.3 % gel Apply 1 application topically at bedtime.   1  . amitriptyline (ELAVIL) 10 MG tablet Take 20 mg by mouth at bedtime as needed.     amphetamine-dextroamphetamine (ADDERALL XR) 30 MG 24 hr capsule Take 1 capsule (30 mg total) by mouth 2 (two) times daily. 60 capsule 0  . brexpiprazole (REXULTI) 1 MG TABS tablet Take 1 tablet (1 mg total) by mouth daily. 30 tablet 0  . diazepam (VALIUM) 5 MG tablet Take 1 tablet (5 mg total) by mouth daily as needed. 30 tablet 2  . drospirenone-ethinyl estradiol (YAZ) 3-0.02 MG tablet TK 1 T PO QD    . DULoxetine (CYMBALTA) 30 MG capsule Take 1 capsule (30 mg total) by mouth daily. 30 capsule 2  . gabapentin (NEURONTIN) 600 MG tablet Take 1 tablet (600 mg total) by mouth 2 (two) times daily. 60 tablet 2  . JUNEL FE 1/20 1-20 MG-MCG tablet Take 1 tablet by mouth daily.  0  . ketoconazole (NIZORAL) 2 % cream Apply 1 application topically daily.  2  . omeprazole (PRILOSEC) 20 MG capsule TK 1 C PO BID    . rizatriptan (MAXALT-MLT) 10 MG disintegrating tablet Take 10 mg by mouth as needed for migraine.   0   No current facility-administered medications for this visit.     Medication Side Effects: Other: Restless legs - cramping at night.  Allergies: No Known Allergies  No past medical history on file.  No family history on file.  Social History   Socioeconomic History  . Marital status: Married    Spouse name: Not on file  . Number of children: Not on file  . Years of education: Not on file  . Highest education level: Not on file  Occupational History  .  Not on file  Social Needs  . Financial resource strain: Not on file  . Food insecurity    Worry: Not on file    Inability: Not on file  . Transportation needs    Medical: Not on file    Non-medical: Not on file  Tobacco Use  . Smoking status: Former Research scientist (life sciences)  . Smokeless tobacco: Never Used  Substance and Sexual Activity  . Alcohol use: Never    Frequency: Never  . Drug use: Never  . Sexual activity: Not on file  Lifestyle  . Physical activity    Days per week: Not on file    Minutes per session: Not on file  . Stress:  Not on file  Relationships  . Social Herbalist on phone: Not on file    Gets together: Not on file    Attends religious service: Not on file    Active member of club or organization: Not on file    Attends meetings of clubs or organizations: Not on file    Relationship status: Not on file  . Intimate partner violence    Fear of current or ex partner: Not on file    Emotionally abused: Not on file    Physically abused: Not on file    Forced sexual activity: Not on file  Other Topics Concern  . Not on file  Social History Narrative  . Not on file    Past Medical History, Surgical history, Social history, and Family history were reviewed and updated as appropriate.   Please see review of systems for further details on the patient's review from today.   Objective:   Physical Exam:  There were no vitals taken for this visit.  Physical Exam Constitutional:      General: She is not in acute distress.    Appearance: She is well-developed.  Musculoskeletal:        General: No deformity.  Neurological:     Mental Status: She is alert and oriented to person, place, and time.     Coordination: Coordination normal.  Psychiatric:        Attention and Perception: Attention and perception normal. She does not perceive auditory or visual hallucinations.        Mood and Affect: Mood is not anxious. Affect is flat. Affect is not labile, blunt, angry or inappropriate.        Speech: Speech normal.        Behavior: Behavior normal.        Thought Content: Thought content normal. Thought content is not paranoid or delusional. Thought content does not include homicidal or suicidal ideation. Thought content does not include homicidal or suicidal plan.        Cognition and Memory: Cognition and memory normal.        Judgment: Judgment normal.     Comments: Insight intact    Lab Review:     Component Value Date/Time   NA 141 04/05/2018 1341   K 3.5 04/05/2018 1341   CL 104  04/05/2018 1341   CO2 27 04/05/2018 1341   GLUCOSE 92 04/05/2018 1341   BUN 10 04/05/2018 1341   CREATININE 0.65 04/05/2018 1341   CALCIUM 8.9 04/05/2018 1341   PROT 6.5 03/22/2018 1222   ALBUMIN 3.6 03/22/2018 1222   AST 19 03/22/2018 1222   ALT 14 03/22/2018 1222   ALKPHOS 73 03/22/2018 1222   BILITOT 0.6 03/22/2018 1222   GFRNONAA >60 04/05/2018 1341  GFRAA >60 04/05/2018 1341       Component Value Date/Time   WBC 7.7 04/05/2018 1341   RBC 4.34 04/05/2018 1341   HGB 13.5 04/05/2018 1341   HCT 40.0 04/05/2018 1341   PLT 357 04/05/2018 1341   MCV 92.2 04/05/2018 1341   MCH 31.1 04/05/2018 1341   MCHC 33.8 04/05/2018 1341   RDW 13.5 04/05/2018 1341   LYMPHSABS 1.5 03/22/2018 1222   MONOABS 0.4 03/22/2018 1222   EOSABS 0.1 03/22/2018 1222   BASOSABS 0.0 03/22/2018 1222    No results found for: POCLITH, LITHIUM   No results found for: PHENYTOIN, PHENOBARB, VALPROATE, CBMZ   .res Assessment: Plan:    Plan: Adderall XR 30mg  BID  Cymbalta 30mg  daily - discontinue Valium 5mg  prn anxiety - uses infrequently Increase Gabapentin 600mg  BID to TID Restart the Rexulti at 0.5mg  BID  Seeing Rockne Menghiniebbie Dowd  Patient advised to contact office with any questions, adverse effects, or acute worsening in signs and symptoms.  Discussed potential benefits, risk, and side effects of benzodiazepines to include potential risk of tolerance and dependence, as well as possible drowsiness.  Advised patient not to drive if experiencing drowsiness and to take lowest possible effective dose to minimize risk of dependence and tolerance.  Discussed potential benefits, risks, and side effects of stimulants with patient to include increased heart rate, palpitations, insomnia, increased anxiety, increased irritability, or decreased appetite.  Instructed patient to contact office if experiencing any significant tolerability issues.  Discussed potential metabolic side effects associated with atypical  antipsychotics, as well as potential risk for movement side effects. Advised pt to contact office if movement side effects occur.   RTC 4 weeks  There are no diagnoses linked to this encounter.   Please see After Visit Summary for patient specific instructions.  Future Appointments  Date Time Provider Department Center  06/26/2019  9:00 AM Mathis Fareowd, Deborah, LCSW [facility] None  07/10/2019  9:00 AM Mathis Fareowd, Deborah, LCSW [facility] None  07/24/2019  9:00 AM Mathis Fareowd, Deborah, LCSW [facility] None  08/07/2019  9:00 AM Mathis Fareowd, Deborah, LCSW [facility] None    No orders of the defined types were placed in this encounter.   -------------------------------

## 2019-06-26 ENCOUNTER — Ambulatory Visit: Admitting: Psychiatry

## 2019-07-10 ENCOUNTER — Ambulatory Visit: Admitting: Psychiatry

## 2019-07-17 ENCOUNTER — Ambulatory Visit (INDEPENDENT_AMBULATORY_CARE_PROVIDER_SITE_OTHER): Admitting: Adult Health

## 2019-07-17 ENCOUNTER — Encounter: Payer: Self-pay | Admitting: Adult Health

## 2019-07-17 ENCOUNTER — Other Ambulatory Visit: Payer: Self-pay

## 2019-07-17 DIAGNOSIS — F411 Generalized anxiety disorder: Secondary | ICD-10-CM | POA: Diagnosis not present

## 2019-07-17 DIAGNOSIS — F902 Attention-deficit hyperactivity disorder, combined type: Secondary | ICD-10-CM

## 2019-07-17 DIAGNOSIS — F429 Obsessive-compulsive disorder, unspecified: Secondary | ICD-10-CM

## 2019-07-17 DIAGNOSIS — G4700 Insomnia, unspecified: Secondary | ICD-10-CM

## 2019-07-17 DIAGNOSIS — F331 Major depressive disorder, recurrent, moderate: Secondary | ICD-10-CM

## 2019-07-17 DIAGNOSIS — F0630 Mood disorder due to known physiological condition, unspecified: Secondary | ICD-10-CM

## 2019-07-17 MED ORDER — AMPHETAMINE-DEXTROAMPHET ER 30 MG PO CP24: 30.0000 mg | ORAL_CAPSULE | Freq: Two times a day (BID) | ORAL | 0 refills | Status: DC

## 2019-07-17 MED ORDER — DULOXETINE HCL 20 MG PO CPEP: 20.0000 mg | ORAL_CAPSULE | Freq: Every day | ORAL | 2 refills | Status: DC

## 2019-07-17 MED ORDER — BUPROPION HCL ER (XL) 150 MG PO TB24: ORAL_TABLET | ORAL | 2 refills | Status: DC

## 2019-07-17 NOTE — Progress Notes (Signed)
Kathryn Henry [id number] [birth year] 34 y.o.  Subjective:   Patient ID:  Kathryn Henry is a 34 y.o. (DOB 21-Aug-1985) female.  Chief Complaint:  Chief Complaint  Patient presents with  . Depression  . Anxiety  . Insomnia  . ADHD  . Other    OCD    HPI Kathryn Henry presents to the office today for follow-up of GAD, MDD, OCD, insomnia, and ADHD.  Describes mood today as "so-so". Pleasant. Flat. Mood symptoms - reports depression. Stating "I have to push myself to do things". Denies anxiety and irritability. Did not tolerate restart of Rexulti - "making me too sleepy". Feels like increase in Gabapentin has helped with anxiety. Has tried to quit Cymbalta, but is having "brain zaps". Decreased interest and motivation. Would like to try Wellbutrin again - "I didn't give it enough time to work". Spent Christmas with daughter and sister. Taking medications as prescribed.  Energy levels low. Active, does not have a regular exercise routine. Works 40 hours a week. Enjoys some usual interests and activities. Lives alone with dog. 71 year old daughter lives with her father in [place].  Appetite adequate. Weight stable. Sleeps well most nights. Averages 10 hours most nights. Focus and concentration stable. Completing tasks. Managing aspects of household. Work going well.  Denies SI or HI. Denies AH or VH.   Previous medications: Abilify, Celexa, Wellbutrin, Prozac, Latuda, Rexulti, Cogentin  Review of Systems:  Review of Systems  Musculoskeletal: Negative for gait problem.  Neurological: Negative for tremors.  Psychiatric/Behavioral:       Please refer to HPI    Medications: I have reviewed the patient's current medications.  Current Outpatient Medications  Medication Sig Dispense Refill  . Adapalene 0.3 % gel Apply 1 application topically at bedtime.   1  . amitriptyline (ELAVIL) 10 MG tablet Take 20 mg by mouth at bedtime as needed.    Marland Kitchen amphetamine-dextroamphetamine (ADDERALL  XR) 30 MG 24 hr capsule Take 1 capsule (30 mg total) by mouth 2 (two) times daily. 60 capsule 0  . brexpiprazole (REXULTI) 1 MG TABS tablet Take 1 tablet (1 mg total) by mouth daily. 30 tablet 0  . buPROPion (WELLBUTRIN XL) 150 MG 24 hr tablet Take one tablet daily x 7 days, then take two tablets. 60 tablet 2  . diazepam (VALIUM) 5 MG tablet Take 1 tablet (5 mg total) by mouth daily as needed. 30 tablet 2  . drospirenone-ethinyl estradiol (YAZ) 3-0.02 MG tablet TK 1 T PO QD    . DULoxetine (CYMBALTA) 20 MG capsule Take 1 capsule (20 mg total) by mouth daily. 30 capsule 2  . gabapentin (NEURONTIN) 600 MG tablet Take 1 tablet (600 mg total) by mouth 3 (three) times daily. 90 tablet 5  . JUNEL FE 1/20 1-20 MG-MCG tablet Take 1 tablet by mouth daily.  0  . ketoconazole (NIZORAL) 2 % cream Apply 1 application topically daily.  2  . omeprazole (PRILOSEC) 20 MG capsule TK 1 C PO BID    . rizatriptan (MAXALT-MLT) 10 MG disintegrating tablet Take 10 mg by mouth as needed for migraine.   0   No current facility-administered medications for this visit.    Medication Side Effects: None  Allergies: No Known Allergies  No past medical history on file.  No family history on file.  Social History   Socioeconomic History  . Marital status: Married    Spouse name: Not on file  . Number of children: Not on file  . Years  of education: Not on file  . Highest education level: Not on file  Occupational History  . Not on file  Tobacco Use  . Smoking status: Former Games developer  . Smokeless tobacco: Never Used  Substance and Sexual Activity  . Alcohol use: Never  . Drug use: Never  . Sexual activity: Not on file  Other Topics Concern  . Not on file  Social History Narrative  . Not on file   Social Determinants of Health   Financial Resource Strain:   . Difficulty of Paying Living Expenses: Not on file  Food Insecurity:   . Worried About Programme researcher, broadcasting/film/video in the Last Year: Not on file  . Ran Out  of Food in the Last Year: Not on file  Transportation Needs:   . Lack of Transportation (Medical): Not on file  . Lack of Transportation (Non-Medical): Not on file  Physical Activity:   . Days of Exercise per Week: Not on file  . Minutes of Exercise per Session: Not on file  Stress:   . Feeling of Stress : Not on file  Social Connections:   . Frequency of Communication with Friends and Family: Not on file  . Frequency of Social Gatherings with Friends and Family: Not on file  . Attends Religious Services: Not on file  . Active Member of Clubs or Organizations: Not on file  . Attends Banker Meetings: Not on file  . Marital Status: Not on file  Intimate Partner Violence:   . Fear of Current or Ex-Partner: Not on file  . Emotionally Abused: Not on file  . Physically Abused: Not on file  . Sexually Abused: Not on file    Past Medical History, Surgical history, Social history, and Family history were reviewed and updated as appropriate.   Please see review of systems for further details on the patient's review from today.   Objective:   Physical Exam:  There were no vitals taken for this visit.  Physical Exam Constitutional:      General: She is not in acute distress.    Appearance: She is well-developed.  Musculoskeletal:        General: No deformity.  Neurological:     Mental Status: She is alert and oriented to person, place, and time.     Coordination: Coordination normal.  Psychiatric:        Attention and Perception: Attention and perception normal. She does not perceive auditory or visual hallucinations.        Mood and Affect: Mood normal. Mood is not anxious or depressed. Affect is not labile, blunt, angry or inappropriate.        Speech: Speech normal.        Behavior: Behavior normal.        Thought Content: Thought content normal. Thought content is not paranoid or delusional. Thought content does not include homicidal or suicidal ideation. Thought  content does not include homicidal or suicidal plan.        Cognition and Memory: Cognition and memory normal.        Judgment: Judgment normal.     Comments: Insight intact     Lab Review:     Component Value Date/Time   NA 141 04/05/2018 1341   K 3.5 04/05/2018 1341   CL 104 04/05/2018 1341   CO2 27 04/05/2018 1341   GLUCOSE 92 04/05/2018 1341   BUN 10 04/05/2018 1341   CREATININE 0.65 04/05/2018 1341   CALCIUM 8.9 04/05/2018  1341   PROT 6.5 03/22/2018 1222   ALBUMIN 3.6 03/22/2018 1222   AST 19 03/22/2018 1222   ALT 14 03/22/2018 1222   ALKPHOS 73 03/22/2018 1222   BILITOT 0.6 03/22/2018 1222   GFRNONAA >60 04/05/2018 1341   GFRAA >60 04/05/2018 1341       Component Value Date/Time   WBC 7.7 04/05/2018 1341   RBC 4.34 04/05/2018 1341   HGB 13.5 04/05/2018 1341   HCT 40.0 04/05/2018 1341   PLT 357 04/05/2018 1341   MCV 92.2 04/05/2018 1341   MCH 31.1 04/05/2018 1341   MCHC 33.8 04/05/2018 1341   RDW 13.5 04/05/2018 1341   LYMPHSABS 1.5 03/22/2018 1222   MONOABS 0.4 03/22/2018 1222   EOSABS 0.1 03/22/2018 1222   BASOSABS 0.0 03/22/2018 1222    No results found for: POCLITH, LITHIUM   No results found for: PHENYTOIN, PHENOBARB, VALPROATE, CBMZ   .res Assessment: Plan:    Plan: Adderall XR 30mg  BID  Cymbalta 30mg  daily - restarted due to discontinuation dificulties Valium 5mg  prn anxiety - uses infrequently Gabapentin 600mg  BID to TID Discontine Rexulti at 0.5mg  BID Add Wellbutrin XL 150mg  daily x 7 days, then 300mg  daily. Denies seizure history.  Seeing  Patient advised to contact office with any questions, adverse effects, or acute worsening in signs and symptoms.  Discussed potential benefits, risk, and side effects of benzodiazepines to include potential risk of tolerance and dependence, as well as possible drowsiness.  Advised patient not to drive if experiencing drowsiness and to take lowest possible effective dose to minimize risk of  dependence and tolerance.  Discussed potential benefits, risks, and side effects of stimulants with patient to include increased heart rate, palpitations, insomnia, increased anxiety, increased irritability, or decreased appetite.  Instructed patient to contact office if experiencing any significant tolerability issues.  Discussed potential metabolic side effects associated with atypical antipsychotics, as well as potential risk for movement side effects. Advised pt to contact office if movement side effects occur.   RTC 4 weeks  Kathryn Henry was seen today for depression, anxiety, insomnia, adhd and other.  Diagnoses and all orders for this visit:  Generalized anxiety disorder -     DULoxetine (CYMBALTA) 20 MG capsule; Take 1 capsule (20 mg total) by mouth daily.  Attention deficit hyperactivity disorder (ADHD), combined type -     buPROPion (WELLBUTRIN XL) 150 MG 24 hr tablet; Take one tablet daily x 7 days, then take two tablets. -     amphetamine-dextroamphetamine (ADDERALL XR) 30 MG 24 hr capsule; Take 1 capsule (30 mg total) by mouth 2 (two) times daily.  Major depressive disorder, recurrent episode, moderate (HCC) -     buPROPion (WELLBUTRIN XL) 150 MG 24 hr tablet; Take one tablet daily x 7 days, then take two tablets. -     DULoxetine (CYMBALTA) 20 MG capsule; Take 1 capsule (20 mg total) by mouth daily.  Insomnia, unspecified type  Obsessive-compulsive disorder, unspecified type  Mood disorder in conditions classified elsewhere -     DULoxetine (CYMBALTA) 20 MG capsule; Take 1 capsule (20 mg total) by mouth daily.     Please see After Visit Summary for patient specific instructions.  Future Appointments  Date Time Provider Department Center  07/24/2019  9:00 AM , LCSW [facility] None  08/07/2019  9:00 AM , LCSW [facility] None    No orders of the defined types were placed in this encounter.   -------------------------------

## 2019-07-24 ENCOUNTER — Ambulatory Visit (INDEPENDENT_AMBULATORY_CARE_PROVIDER_SITE_OTHER): Admitting: Psychiatry

## 2019-07-24 ENCOUNTER — Other Ambulatory Visit: Payer: Self-pay

## 2019-07-24 DIAGNOSIS — F411 Generalized anxiety disorder: Secondary | ICD-10-CM

## 2019-07-24 NOTE — Progress Notes (Signed)
[facility] Counselor/Therapist Progress Note  Patient ID: Kathryn Henry, MRN: [id number],    Date: 07/24/2019  Time Spent:  60 minutes   9:00am to 10:00am  Treatment Type: Individual Therapy  Reported Symptoms: anxiety, some depression   Mental Status Exam:  Appearance:   Neat     Behavior:  Appropriate and Sharing  Motor:  Normal  Speech/Language:   Normal Rate  Affect:  anxious, depressed  Mood:  anxious and depressed  Thought process:  normal  Thought content:    WNL  Sensory/Perceptual disturbances:    WNL  Orientation:  oriented to person, place, time/date, situation, day of week, month of year and year  Attention:  Good  Concentration:  Good  Memory:  WNL  Fund of knowledge:   Good  Insight:    Good  Judgment:   Good  Impulse Control:  Fair   Risk Assessment: Danger to Self:  No Self-injurious Behavior: No Danger to Others: No Duty to Warn:no Physical Aggression / Violence:No  Access to Firearms a concern: No  Gang Involvement:No   Subjective: Patient in today with continued anxiety and depression.  Personal and work issues.  Recent incident at work when patient "stood up for herself" and that led to altercation with her manager and patient reports "nothing bad came from it."    Interventions: Solution-Oriented/Positive Psychology and Ego-Supportive  Diagnosis:   ICD-10-CM   1. Generalized anxiety disorder  F41.1     Plan: Patient not signing tx plan on computer screen due to COVID.  Treatment Goals: Goals remain on tx plan as patient works on strategies to meet her goals.Progress will be documented each session in the "Progress" section.  Long term goal: Develop healthy interpersonal relationships that help lead to alleviation of depressionand anxiety,and prevent relapse.  Short term goal: Identify and replace depressive thinking that leads to depressive/anxiousfeelings and actions.  Strategy: Educate patient about cognitive  restructuring including self-monitoring of automatic thoughts that support depression and/or anxiety. Will work to stop/interrupt the negative automatic thoughts and replace the with more positive, self-affirming thoughts.  Progress: Patient in today after an incident at work with her manager recently where she had been asked why she did not do a certain task which was not her job.  Patient states "I took up for myself".  Not longer taking Latuda and now taking Wellbutrin. Pateint states "I try not to think as negatively and focus on just one day at the time and that has seemed to help some." Feels there has been a definite decrease in her negatvie/anxious thoughts. Not as much tearfulness when alone. Still in close contact with her sisiter. Not feeling as tired and feels med switch has helped that.  Has more hopefulness with the Wellbutrin as she earlier doubted a medicine could help her.  Doesn't stay as much closed up in her office. Some more motivated. Feels others judge her , how she looks etc, and found that using her earbuds helps her focus on her music rather than what she thinks others think.  Chose not to share more info on her situation, as mentioned last session.  Stated that she decided not to right now but sister is aware of her seeking tx for anxiety and depression.  Assignment re: monitoring thoughts---not able to complete but did turn around negative/anxious thoughts as she caught them and that has helped her. States she wants to get out more between now and next appt, visit her daughter, and to  continue to feel more motivated. Goal review and progress noted with patient.  Next appt within 2-3 weeks.   Shanon Ace, LCSW

## 2019-08-03 ENCOUNTER — Telehealth: Payer: Self-pay | Admitting: Adult Health

## 2019-08-03 NOTE — Telephone Encounter (Signed)
Kathryn Henry called to report that since taking the Wellbutrin she has started having very severe gastric issues and is very nauseous.  She doesn't think she can tolerate this medication.  She has taken it about 2 weeks.  Please call advice on what she should do the relieve the symptoms.

## 2019-08-03 NOTE — Telephone Encounter (Signed)
Have her stop the Wellbutrin and call the office on Monday with an update.

## 2019-08-07 ENCOUNTER — Other Ambulatory Visit: Payer: Self-pay

## 2019-08-07 ENCOUNTER — Ambulatory Visit (INDEPENDENT_AMBULATORY_CARE_PROVIDER_SITE_OTHER): Admitting: Psychiatry

## 2019-08-07 DIAGNOSIS — F411 Generalized anxiety disorder: Secondary | ICD-10-CM

## 2019-08-07 NOTE — Telephone Encounter (Signed)
Left patient detailed voicemail confirming her sx's continue to improve. Advised to call if wanting a sooner apt on 08/14/2019 to discuss her medications.

## 2019-08-07 NOTE — Telephone Encounter (Signed)
Noted. We can discuss other options as next appointment.

## 2019-08-07 NOTE — Progress Notes (Signed)
[facility] Counselor/Therapist Progress Note  Patient ID: Kathryn Henry, MRN: [id number],    Date: 08/07/2019  Time Spent: 60 minutes  9:02am to 10:02am  Treatment Type: Individual Therapy  Reported Symptoms: anxiety, depression  Mental Status Exam:  Appearance:   Casual     Behavior:  Appropriate and Sharing  Motor:  Normal  Speech/Language:   Normal Rate  Affect:  anxious, depressed  Mood:  anxious and depressed  Thought process:  normal  Thought content:    WNL  Sensory/Perceptual disturbances:    WNL  Orientation:  oriented to person, place, time/date, situation, day of week, month of year and year  Attention:  Good  Concentration:  Good  Memory:  WNL  Fund of knowledge:   Good  Insight:    Good  Judgment:   Good  Impulse Control:  Good   Risk Assessment: Danger to Self:  No Self-injurious Behavior: No Danger to Others: No Duty to Warn:no Physical Aggression / Violence:No  Access to Firearms a concern: No  Gang Involvement:No   Subjective: Patient today reports that she got sick with her Wellbutrin and stopped it, spoke with nurse here last week and has felt better since stopping it.  Some better, less tearfulness, some decrease in depression.  Anxiety is stronger symptom.  Interventions: Cognitive Behavioral Therapy and Solution-Oriented/Positive Psychology  Diagnosis:   ICD-10-CM   1. Generalized anxiety disorder  F41.1      Plan: Patient not signing tx plan on computer screen due to COVID.  Treatment Goals: Goals remain on tx plan as patient works on strategies to meet her goals.Progress will be documented each session in the "Progress" section.  Long term goal: Develop healthy interpersonal relationships that help lead to alleviation of depressionand anxiety,and prevent relapse.  Short term goal: Identify and replace depressive thinking that leads to depressive/anxiousfeelings and actions.  Strategy: Educate patient about  cognitive restructuring including self-monitoring of automatic thoughts that support depression and/or anxiety. Will work to stop/interrupt the negative automatic thoughts and replace the with more positive, self-affirming thoughts.  Progress: Patient states situation at work has improved since last session. Anxiety worse, doesn't like having to be away from daughter due to daughter having virtual school and patient having to work during the day.  Daughter stays with her dad which is now closer and patient can visit her more often.  On 1-10 scale of anxiety, rates herself a "5". States anxiety is worse in crowds, "makes me nervous and get hot and sweaty". Uses her earbuds and that helps a lot.  Want to be "more better"--to be happier like waking up and actually feeling happy, enjoying my job, to feel less anxious. Wants to do more things that she might be interested in but very hard for her to find things of interest to her which we spoke about today.  Some anxiety that daughter can't be with her right now, but tries to focus on next visit with her and that can help. Worked more today also on interrupting her anxious/depressive thoughts and changing them to be more positive, realistic, and empowering thoughts that do not support anxiety or depression. To work more on this as she knows she doesn't catch all the negative/anxious thougths re: her daughter; to bring in acutal examples for [place] to discuss next visit. States she feel that she is making progress.  Goal review and progress noted with patient.   Next appt within 2 weeks.   Mathis Fare, LCSW

## 2019-08-10 ENCOUNTER — Telehealth: Payer: Self-pay | Admitting: Adult Health

## 2019-08-10 ENCOUNTER — Other Ambulatory Visit: Payer: Self-pay | Admitting: Adult Health

## 2019-08-10 NOTE — Telephone Encounter (Signed)
Has she been on Lamictal? If not - I'm going to start her on that 25mg  at hs x 14, then 50mg  at hs.

## 2019-08-10 NOTE — Telephone Encounter (Signed)
Pt would like something called in for her in place of the Wellbutrin. Pt does not want to wait until next appt. Please send to [facility] [address].

## 2019-08-14 ENCOUNTER — Other Ambulatory Visit: Payer: Self-pay

## 2019-08-14 ENCOUNTER — Ambulatory Visit (INDEPENDENT_AMBULATORY_CARE_PROVIDER_SITE_OTHER): Admitting: Adult Health

## 2019-08-14 ENCOUNTER — Encounter: Payer: Self-pay | Admitting: Adult Health

## 2019-08-14 DIAGNOSIS — G4700 Insomnia, unspecified: Secondary | ICD-10-CM

## 2019-08-14 DIAGNOSIS — F429 Obsessive-compulsive disorder, unspecified: Secondary | ICD-10-CM

## 2019-08-14 DIAGNOSIS — F902 Attention-deficit hyperactivity disorder, combined type: Secondary | ICD-10-CM

## 2019-08-14 DIAGNOSIS — F411 Generalized anxiety disorder: Secondary | ICD-10-CM

## 2019-08-14 DIAGNOSIS — F331 Major depressive disorder, recurrent, moderate: Secondary | ICD-10-CM | POA: Diagnosis not present

## 2019-08-14 MED ORDER — LAMOTRIGINE 25 MG PO TABS: ORAL_TABLET | ORAL | 2 refills | Status: DC

## 2019-08-14 MED ORDER — AMPHETAMINE-DEXTROAMPHET ER 30 MG PO CP24: 30.0000 mg | ORAL_CAPSULE | Freq: Two times a day (BID) | ORAL | 0 refills | Status: DC

## 2019-08-14 NOTE — Progress Notes (Signed)
Kathryn Henry [id number] 02/18/1986 34 y.o.  Subjective:   Patient ID:  Kathryn Henry is a 34 y.o. (DOB [birth year]) female.  Chief Complaint: No chief complaint on file.   HPI Kathryn Henry presents to the office today for follow-up of GAD, MDD, OCD, insomnia, and ADHD.  Describes mood today as "not good". Pleasant. Flat. Mood symptoms - reports depression. Denies anxiety and irritability. Stating "I have no energy for nothing". Has difficulties getting herself "dressed and showering". Having to "push" through "everything". Currently being treated for a sinus infection - Amoxicillin. Has stopped Wellbutrin - "made me too sick on my stomach". Mostly staying at home. Talking with daughter on the phone. Concerned about missing work. Taking medications as prescribed.  Energy levels low. Active, does not have a regular exercise routine. Works 40 hours a week. Enjoys some usual interests and activities. Divorced. Lives alone with dog. Has a 41 year old daughter that lives with her father in [place].  Appetite adequate. Weight loss on Wellbutrin - deceased appetite. Now improving off of Wellbutrin.  Sleeps well most nights. Averages 10 hours most nights. Focus and concentration stable. Completing tasks. Managing aspects of household. Work going well.  Denies SI or HI. Denies AH or VH.   Previous medications: Abilify, Celexa, Wellbutrin, Prozac, Latuda, Rexulti, Cogentin  Review of Systems:  Review of Systems  Musculoskeletal: Negative for gait problem.  Neurological: Negative for tremors.  Psychiatric/Behavioral:       Please refer to HPI    Medications: I have reviewed the patient's current medications.  Current Outpatient Medications  Medication Sig Dispense Refill  . Adapalene 0.3 % gel Apply 1 application topically at bedtime.   1  . amitriptyline (ELAVIL) 10 MG tablet Take 20 mg by mouth at bedtime as needed.    Marland Kitchen amphetamine-dextroamphetamine (ADDERALL XR) 30 MG 24 hr  capsule Take 1 capsule (30 mg total) by mouth 2 (two) times daily. 60 capsule 0  . diazepam (VALIUM) 5 MG tablet Take 1 tablet (5 mg total) by mouth daily as needed. 30 tablet 2  . drospirenone-ethinyl estradiol (YAZ) 3-0.02 MG tablet TK 1 T PO QD    . DULoxetine (CYMBALTA) 20 MG capsule Take 1 capsule (20 mg total) by mouth daily. 30 capsule 2  . gabapentin (NEURONTIN) 600 MG tablet Take 1 tablet (600 mg total) by mouth 3 (three) times daily. 90 tablet 5  . JUNEL FE 1/20 1-20 MG-MCG tablet Take 1 tablet by mouth daily.  0  . ketoconazole (NIZORAL) 2 % cream Apply 1 application topically daily.  2  . lamoTRIgine (LAMICTAL) 25 MG tablet Take one tablet at bedtime x 14 days, then take two tablets at bedtime. 60 tablet 2  . omeprazole (PRILOSEC) 20 MG capsule TK 1 C PO BID    . rizatriptan (MAXALT-MLT) 10 MG disintegrating tablet Take 10 mg by mouth as needed for migraine.   0   No current facility-administered medications for this visit.    Medication Side Effects: None  Allergies: No Known Allergies  History reviewed. No pertinent past medical history.  History reviewed. No pertinent family history.  Social History   Socioeconomic History  . Marital status: Married    Spouse name: Not on file  . Number of children: Not on file  . Years of education: Not on file  . Highest education level: Not on file  Occupational History  . Not on file  Tobacco Use  . Smoking status: Former Games developer  . Smokeless tobacco: Never  Used  Substance and Sexual Activity  . Alcohol use: Never  . Drug use: Never  . Sexual activity: Not on file  Other Topics Concern  . Not on file  Social History Narrative  . Not on file   Social Determinants of Health   Financial Resource Strain:   . Difficulty of Paying Living Expenses: Not on file  Food Insecurity:   . Worried About Programme researcher, broadcasting/film/video in the Last Year: Not on file  . Ran Out of Food in the Last Year: Not on file  Transportation Needs:   .  Lack of Transportation (Medical): Not on file  . Lack of Transportation (Non-Medical): Not on file  Physical Activity:   . Days of Exercise per Week: Not on file  . Minutes of Exercise per Session: Not on file  Stress:   . Feeling of Stress : Not on file  Social Connections:   . Frequency of Communication with Friends and Family: Not on file  . Frequency of Social Gatherings with Friends and Family: Not on file  . Attends Religious Services: Not on file  . Active Member of Clubs or Organizations: Not on file  . Attends Banker Meetings: Not on file  . Marital Status: Not on file  Intimate Partner Violence:   . Fear of Current or Ex-Partner: Not on file  . Emotionally Abused: Not on file  . Physically Abused: Not on file  . Sexually Abused: Not on file    Past Medical History, Surgical history, Social history, and Family history were reviewed and updated as appropriate.   Please see review of systems for further details on the patient's review from today.   Objective:   Physical Exam:  There were no vitals taken for this visit.  Physical Exam Constitutional:      General: She is not in acute distress.    Appearance: She is well-developed.  Musculoskeletal:        General: No deformity.  Neurological:     Mental Status: She is alert and oriented to person, place, and time.     Coordination: Coordination normal.  Psychiatric:        Attention and Perception: Attention and perception normal. She does not perceive auditory or visual hallucinations.        Mood and Affect: Mood is depressed. Mood is not anxious. Affect is not labile, blunt, angry or inappropriate.        Speech: Speech normal.        Behavior: Behavior normal.        Thought Content: Thought content normal. Thought content is not paranoid or delusional. Thought content does not include homicidal or suicidal ideation. Thought content does not include homicidal or suicidal plan.        Cognition and  Memory: Cognition and memory normal.        Judgment: Judgment normal.     Comments: Insight intact     Lab Review:     Component Value Date/Time   NA 141 04/05/2018 1341   K 3.5 04/05/2018 1341   CL 104 04/05/2018 1341   CO2 27 04/05/2018 1341   GLUCOSE 92 04/05/2018 1341   BUN 10 04/05/2018 1341   CREATININE 0.65 04/05/2018 1341   CALCIUM 8.9 04/05/2018 1341   PROT 6.5 03/22/2018 1222   ALBUMIN 3.6 03/22/2018 1222   AST 19 03/22/2018 1222   ALT 14 03/22/2018 1222   ALKPHOS 73 03/22/2018 1222   BILITOT 0.6 03/22/2018  [place] 04/05/2018 1341   GFRAA >60 04/05/2018 1341       Component Value Date/Time   WBC 7.7 04/05/2018 1341   RBC 4.34 04/05/2018 1341   HGB 13.5 04/05/2018 1341   HCT 40.0 04/05/2018 1341   PLT 357 04/05/2018 1341   MCV 92.2 04/05/2018 1341   MCH 31.1 04/05/2018 1341   MCHC 33.8 04/05/2018 1341   RDW 13.5 04/05/2018 1341   LYMPHSABS 1.5 03/22/2018 1222   MONOABS 0.4 03/22/2018 1222   EOSABS 0.1 03/22/2018 1222   BASOSABS 0.0 03/22/2018 1222    No results found for: POCLITH, LITHIUM   No results found for: PHENYTOIN, PHENOBARB, VALPROATE, CBMZ   .res Assessment: Plan:    Plan: Adderall XR 30mg  BID  Cymbalta 30mg  daily - restarted due to discontinuation dificulties Valium 5mg  prn anxiety - uses infrequently Gabapentin 600mg  TID Add Lamictal 25mg  at hs x 14 days, then 50mg  at hs.   Therapist - Rinaldo Cloud  Patient advised to contact office with any questions, adverse effects, or acute worsening in signs and symptoms.  Discussed potential benefits, risk, and side effects of benzodiazepines to include potential risk of tolerance and dependence, as well as possible drowsiness.  Advised patient not to drive if experiencing drowsiness and to take lowest possible effective dose to minimize risk of dependence and tolerance.  Discussed potential benefits, risks, and side effects of stimulants with patient to include increased heart  rate, palpitations, insomnia, increased anxiety, increased irritability, or decreased appetite.  Instructed patient to contact office if experiencing any significant tolerability issues.  Counseled patient regarding potential benefits, risks, and side effects of Lamictal to include potential risk of Stevens-Johnson syndrome. Advised patient to stop taking Lamictal and contact office immediately if rash develops and to seek urgent medical attention if rash is severe and/or spreading quickly.  RTC 4 weeks   Diagnoses and all orders for this visit:  Generalized anxiety disorder  Attention deficit hyperactivity disorder (ADHD), combined type -     amphetamine-dextroamphetamine (ADDERALL XR) 30 MG 24 hr capsule; Take 1 capsule (30 mg total) by mouth 2 (two) times daily.  Major depressive disorder, recurrent episode, moderate (HCC) -     lamoTRIgine (LAMICTAL) 25 MG tablet; Take one tablet at bedtime x 14 days, then take two tablets at bedtime.  Insomnia, unspecified type  Obsessive-compulsive disorder, unspecified type     Please see After Visit Summary for patient specific instructions.  Future Appointments  Date Time Provider [employer]  08/21/2019 10:00 AM Shanon Ace, LCSW [facility] None  09/04/2019 10:00 AM Shanon Ace, LCSW [facility] None  09/18/2019 10:00 AM Shanon Ace, LCSW [facility] None  10/02/2019 10:00 AM Shanon Ace, LCSW [facility] None    No orders of the defined types were placed in this encounter.   -------------------------------

## 2019-08-14 NOTE — Telephone Encounter (Signed)
Pt. Was seen this morning and Lamictal was written for her.

## 2019-08-14 NOTE — Telephone Encounter (Signed)
Noted thank you

## 2019-08-15 ENCOUNTER — Telehealth: Payer: Self-pay | Admitting: Adult Health

## 2019-08-15 NOTE — Telephone Encounter (Signed)
Pt started Lamictal last night. Did not sleep at all. Not usual for her. (not feeling tired either).

## 2019-08-15 NOTE — Telephone Encounter (Signed)
Left detailed voicemail with information and to call back with further questions or concerns

## 2019-08-15 NOTE — Telephone Encounter (Signed)
Should not interfere with sleep. Can move to morning.

## 2019-08-21 ENCOUNTER — Ambulatory Visit: Admitting: Psychiatry

## 2019-09-01 ENCOUNTER — Telehealth: Payer: Self-pay | Admitting: Adult Health

## 2019-09-01 NOTE — Telephone Encounter (Signed)
Pt called stating she started lamictal 08/14/19 and noticed yesterday she has red patches on her face & neck. Please advise @ [phone]

## 2019-09-04 ENCOUNTER — Ambulatory Visit: Admitting: Psychiatry

## 2019-09-07 ENCOUNTER — Other Ambulatory Visit: Payer: Self-pay | Admitting: Adult Health

## 2019-09-07 ENCOUNTER — Telehealth: Payer: Self-pay | Admitting: Adult Health

## 2019-09-07 DIAGNOSIS — F331 Major depressive disorder, recurrent, moderate: Secondary | ICD-10-CM

## 2019-09-07 DIAGNOSIS — F411 Generalized anxiety disorder: Secondary | ICD-10-CM

## 2019-09-07 MED ORDER — DESVENLAFAXINE SUCCINATE ER 50 MG PO TB24: 50.0000 mg | ORAL_TABLET | Freq: Every day | ORAL | 2 refills | Status: DC

## 2019-09-07 NOTE — Telephone Encounter (Signed)
Pls advise patient that I have sent in Pristiq 50mg  every morning for the anxiety and depression.

## 2019-09-07 NOTE — Telephone Encounter (Signed)
Lillia called to report that the rash from the lamictal is gone.  She was told to call back when it was gone so you could prescribe something else.

## 2019-09-14 ENCOUNTER — Other Ambulatory Visit: Payer: Self-pay

## 2019-09-14 ENCOUNTER — Encounter: Payer: Self-pay | Admitting: Adult Health

## 2019-09-14 ENCOUNTER — Ambulatory Visit (INDEPENDENT_AMBULATORY_CARE_PROVIDER_SITE_OTHER): Admitting: Adult Health

## 2019-09-14 DIAGNOSIS — F429 Obsessive-compulsive disorder, unspecified: Secondary | ICD-10-CM

## 2019-09-14 DIAGNOSIS — F902 Attention-deficit hyperactivity disorder, combined type: Secondary | ICD-10-CM

## 2019-09-14 DIAGNOSIS — G4700 Insomnia, unspecified: Secondary | ICD-10-CM | POA: Diagnosis not present

## 2019-09-14 DIAGNOSIS — F411 Generalized anxiety disorder: Secondary | ICD-10-CM | POA: Diagnosis not present

## 2019-09-14 DIAGNOSIS — F331 Major depressive disorder, recurrent, moderate: Secondary | ICD-10-CM

## 2019-09-14 NOTE — Progress Notes (Signed)
Kathryn Henry [id number] 1986/04/07 34 y.o.  Subjective:   Patient ID:  Kathryn Henry is a 34 y.o. (DOB 05/28/1986) female.  Chief Complaint: No chief complaint on file.   HPI Kathryn Henry presents to the office today for follow-up of GAD, MDD, OCD, insomnia, and ADHD.  Describes mood today as "not so good". Pleasant. Flat. Mood symptoms - reports depression, anxiety and irritability. Stating "I'm not doing good". Felt like Prozac was most helpful, but ended up in the hospital over it. Would like to try the genetic testing to see what might work for her. Stating "I have to push myself to do anything". Difficulties showering and completing self care. Mostly staying at home when not at work.. Talking with daughter on the phone. Taking medications as prescribed.  Energy levels "very" low. Active, does not have a regular exercise routine. Works 40 hours a week. Enjoys some usual interests and activities. Divorced. Lives alone with dog. Has a 59 year old daughter that she visits once a week. Appetite decreased - "having to force herself to eat". Weight loss - 10 pounds. Sleeps well most nights. Averages "at least 10 hours" most nights. Focus and concentration difficulties at times. Completing tasks. Managing aspects of household. Work going well.  Denies SI or HI. Denies AH or VH.   Previous medications: Abilify, Celexa, Wellbutrin, Prozac, Latuda, Rexulti, Cogentin, Lamictal-rash    Review of Systems:  Review of Systems  Musculoskeletal: Negative for gait problem.  Neurological: Negative for tremors.  Psychiatric/Behavioral:       Please refer to HPI    Medications: I have reviewed the patient's current medications.  Current Outpatient Medications  Medication Sig Dispense Refill  . Adapalene 0.3 % gel Apply 1 application topically at bedtime.   1  . amitriptyline (ELAVIL) 10 MG tablet Take 20 mg by mouth at bedtime as needed.    Marland Kitchen amphetamine-dextroamphetamine (ADDERALL XR) 30  MG 24 hr capsule Take 1 capsule (30 mg total) by mouth 2 (two) times daily. 60 capsule 0  . desvenlafaxine (PRISTIQ) 50 MG 24 hr tablet Take 1 tablet (50 mg total) by mouth daily. 30 tablet 2  . diazepam (VALIUM) 5 MG tablet Take 1 tablet (5 mg total) by mouth daily as needed. 30 tablet 2  . drospirenone-ethinyl estradiol (YAZ) 3-0.02 MG tablet TK 1 T PO QD    . DULoxetine (CYMBALTA) 20 MG capsule Take 1 capsule (20 mg total) by mouth daily. 30 capsule 2  . gabapentin (NEURONTIN) 600 MG tablet Take 1 tablet (600 mg total) by mouth 3 (three) times daily. 90 tablet 5  . JUNEL FE 1/20 1-20 MG-MCG tablet Take 1 tablet by mouth daily.  0  . ketoconazole (NIZORAL) 2 % cream Apply 1 application topically daily.  2  . lamoTRIgine (LAMICTAL) 25 MG tablet Take one tablet at bedtime x 14 days, then take two tablets at bedtime. 60 tablet 2  . omeprazole (PRILOSEC) 20 MG capsule TK 1 C PO BID    . rizatriptan (MAXALT-MLT) 10 MG disintegrating tablet Take 10 mg by mouth as needed for migraine.   0   No current facility-administered medications for this visit.    Medication Side Effects: None  Allergies: No Known Allergies  No past medical history on file.  No family history on file.  Social History   Socioeconomic History  . Marital status: Married    Spouse name: Not on file  . Number of children: Not on file  . Years of education:  Not on file  . Highest education level: Not on file  Occupational History  . Not on file  Tobacco Use  . Smoking status: Former Games developer  . Smokeless tobacco: Never Used  Substance and Sexual Activity  . Alcohol use: Never  . Drug use: Never  . Sexual activity: Not on file  Other Topics Concern  . Not on file  Social History Narrative  . Not on file   Social Determinants of Health   Financial Resource Strain:   . Difficulty of Paying Living Expenses: Not on file  Food Insecurity:   . Worried About Programme researcher, broadcasting/film/video in the Last Year: Not on file  . Ran  Out of Food in the Last Year: Not on file  Transportation Needs:   . Lack of Transportation (Medical): Not on file  . Lack of Transportation (Non-Medical): Not on file  Physical Activity:   . Days of Exercise per Week: Not on file  . Minutes of Exercise per Session: Not on file  Stress:   . Feeling of Stress : Not on file  Social Connections:   . Frequency of Communication with Friends and Family: Not on file  . Frequency of Social Gatherings with Friends and Family: Not on file  . Attends Religious Services: Not on file  . Active Member of Clubs or Organizations: Not on file  . Attends Banker Meetings: Not on file  . Marital Status: Not on file  Intimate Partner Violence:   . Fear of Current or Ex-Partner: Not on file  . Emotionally Abused: Not on file  . Physically Abused: Not on file  . Sexually Abused: Not on file    Past Medical History, Surgical history, Social history, and Family history were reviewed and updated as appropriate.   Please see review of systems for further details on the patient's review from today.   Objective:   Physical Exam:  There were no vitals taken for this visit.  Physical Exam Constitutional:      General: She is not in acute distress. Musculoskeletal:        General: No deformity.  Neurological:     Mental Status: She is alert and oriented to person, place, and time.     Coordination: Coordination normal.  Psychiatric:        Attention and Perception: Attention and perception normal. She does not perceive auditory or visual hallucinations.        Mood and Affect: Mood normal. Mood is not anxious or depressed. Affect is not labile, blunt, angry or inappropriate.        Speech: Speech normal.        Behavior: Behavior normal.        Thought Content: Thought content normal. Thought content is not paranoid or delusional. Thought content does not include homicidal or suicidal ideation. Thought content does not include homicidal or  suicidal plan.        Cognition and Memory: Cognition and memory normal.        Judgment: Judgment normal.     Comments: Insight intact     Lab Review:     Component Value Date/Time   NA 141 04/05/2018 1341   K 3.5 04/05/2018 1341   CL 104 04/05/2018 1341   CO2 27 04/05/2018 1341   GLUCOSE 92 04/05/2018 1341   BUN 10 04/05/2018 1341   CREATININE 0.65 04/05/2018 1341   CALCIUM 8.9 04/05/2018 1341   PROT 6.5 03/22/2018 1222   ALBUMIN  3.6 03/22/2018 1222   AST 19 03/22/2018 1222   ALT 14 03/22/2018 1222   ALKPHOS 73 03/22/2018 1222   BILITOT 0.6 03/22/2018 1222   GFRNONAA >60 04/05/2018 1341   GFRAA >60 04/05/2018 1341       Component Value Date/Time   WBC 7.7 04/05/2018 1341   RBC 4.34 04/05/2018 1341   HGB 13.5 04/05/2018 1341   HCT 40.0 04/05/2018 1341   PLT 357 04/05/2018 1341   MCV 92.2 04/05/2018 1341   MCH 31.1 04/05/2018 1341   MCHC 33.8 04/05/2018 1341   RDW 13.5 04/05/2018 1341   LYMPHSABS 1.5 03/22/2018 1222   MONOABS 0.4 03/22/2018 1222   EOSABS 0.1 03/22/2018 1222   BASOSABS 0.0 03/22/2018 1222    No results found for: POCLITH, LITHIUM   No results found for: PHENYTOIN, PHENOBARB, VALPROATE, CBMZ   .res Assessment: Plan:    Plan: Adderall XR 30mg  BID  Cymbalta 20mg  daily  Valium 5mg  prn anxiety - uses infrequently Gabapentin 600mg  TID  Will order genetic testing for multiple medication failures. Will review and have patient schedule an appt to discuss.  Therapist - Rinaldo Cloud  Patient advised to contact office with any questions, adverse effects, or acute worsening in signs and symptoms.  Discussed potential benefits, risk, and side effects of benzodiazepines to include potential risk of tolerance and dependence, as well as possible drowsiness.  Advised patient not to drive if experiencing drowsiness and to take lowest possible effective dose to minimize risk of dependence and tolerance.  Discussed potential benefits, risks, and side  effects of stimulants with patient to include increased heart rate, palpitations, insomnia, increased anxiety, increased irritability, or decreased appetite.  Instructed patient to contact office if experiencing any significant tolerability issues.   RTC 4 weeks   There are no diagnoses linked to this encounter.   Please see After Visit Summary for patient specific instructions.  Future Appointments  Date Time Provider [place]  09/18/2019 10:00 AM Shanon Ace, LCSW [facility] None  10/02/2019 10:00 AM Shanon Ace, LCSW [facility] None    No orders of the defined types were placed in this encounter.   -------------------------------

## 2019-09-18 ENCOUNTER — Ambulatory Visit: Admitting: Psychiatry

## 2019-10-02 ENCOUNTER — Ambulatory Visit: Admitting: Psychiatry

## 2019-10-03 ENCOUNTER — Encounter: Payer: Self-pay | Admitting: Adult Health

## 2019-10-03 ENCOUNTER — Other Ambulatory Visit: Payer: Self-pay

## 2019-10-03 ENCOUNTER — Ambulatory Visit (INDEPENDENT_AMBULATORY_CARE_PROVIDER_SITE_OTHER): Admitting: Adult Health

## 2019-10-03 DIAGNOSIS — F411 Generalized anxiety disorder: Secondary | ICD-10-CM

## 2019-10-03 DIAGNOSIS — F902 Attention-deficit hyperactivity disorder, combined type: Secondary | ICD-10-CM

## 2019-10-03 DIAGNOSIS — F329 Major depressive disorder, single episode, unspecified: Secondary | ICD-10-CM | POA: Diagnosis not present

## 2019-10-03 DIAGNOSIS — F429 Obsessive-compulsive disorder, unspecified: Secondary | ICD-10-CM

## 2019-10-03 DIAGNOSIS — G4700 Insomnia, unspecified: Secondary | ICD-10-CM

## 2019-10-03 DIAGNOSIS — F32A Depression, unspecified: Secondary | ICD-10-CM

## 2019-10-03 MED ORDER — AMPHETAMINE-DEXTROAMPHET ER 30 MG PO CP24: 30.0000 mg | ORAL_CAPSULE | Freq: Two times a day (BID) | ORAL | 0 refills | Status: DC

## 2019-10-03 NOTE — Progress Notes (Signed)
Kathryn Henry [id number] 15-Jan-1986 34 y.o.  Subjective:   Patient ID:  Kathryn Henry is a 34 y.o. (DOB 04/22/1986) female.  Chief Complaint: No chief complaint on file.   HPI Kathryn Henry presents to the office today for follow-up of GAD, MDD, OCD, insomnia, and ADHD.  Describes mood today as "sad". Pleasant. Flat. Mood symptoms - reports depression "sad at times". Denies anxiety. Feels irritable at times. Stating "I'm still not feeling good". Has received genetic testing and reviewed. Continues to have difficulties with interest and motivation. Low energy and having to "push" herself. Mostly stays home when not at work.. Talking with daughter on the phone. Willing to stop the Cymbalta and add Pristiq for mood improvement.  Taking medications as prescribed.  Energy levels "very low". Active, does not have a regular exercise routine. Works 40 hours a week. Enjoys some usual interests and activities. Divorced. Single. Lives alone with dog. Has a 67 year old daughter that she visits once a week. Appetite adequate. Weight gain - 5 pounds. Sleeps well most nights. Averages "10" hours.  Focus and concentration difficulties at times - Adderall helpful. Completing tasks. Managing aspects of household. Work going well.  Denies SI or HI. Denies AH or VH.   Previous medications: Abilify, Celexa, Wellbutrin, Prozac, Latuda, Rexulti, Cogentin, Lamictal-rash  Review of Systems:  Review of Systems  Musculoskeletal: Negative for gait problem.  Neurological: Negative for tremors.  Psychiatric/Behavioral:       Please refer to HPI    Medications: I have reviewed the patient's current medications.  Current Outpatient Medications  Medication Sig Dispense Refill  . Adapalene 0.3 % gel Apply 1 application topically at bedtime.   1  . amitriptyline (ELAVIL) 10 MG tablet Take 20 mg by mouth at bedtime as needed.    Marland Kitchen amphetamine-dextroamphetamine (ADDERALL XR) 30 MG 24 hr capsule Take 1 capsule  (30 mg total) by mouth 2 (two) times daily. 60 capsule 0  . desvenlafaxine (PRISTIQ) 50 MG 24 hr tablet Take 1 tablet (50 mg total) by mouth daily. 30 tablet 2  . diazepam (VALIUM) 5 MG tablet Take 1 tablet (5 mg total) by mouth daily as needed. 30 tablet 2  . drospirenone-ethinyl estradiol (YAZ) 3-0.02 MG tablet TK 1 T PO QD    . DULoxetine (CYMBALTA) 20 MG capsule Take 1 capsule (20 mg total) by mouth daily. 30 capsule 2  . gabapentin (NEURONTIN) 600 MG tablet Take 1 tablet (600 mg total) by mouth 3 (three) times daily. 90 tablet 5  . JUNEL FE 1/20 1-20 MG-MCG tablet Take 1 tablet by mouth daily.  0  . ketoconazole (NIZORAL) 2 % cream Apply 1 application topically daily.  2  . lamoTRIgine (LAMICTAL) 25 MG tablet Take one tablet at bedtime x 14 days, then take two tablets at bedtime. 60 tablet 2  . omeprazole (PRILOSEC) 20 MG capsule TK 1 C PO BID    . rizatriptan (MAXALT-MLT) 10 MG disintegrating tablet Take 10 mg by mouth as needed for migraine.   0   No current facility-administered medications for this visit.    Medication Side Effects: None  Allergies: No Known Allergies  No past medical history on file.  No family history on file.  Social History   Socioeconomic History  . Marital status: Married    Spouse name: Not on file  . Number of children: Not on file  . Years of education: Not on file  . Highest education level: Not on file  Occupational History  .  Not on file  Tobacco Use  . Smoking status: Former Games developer  . Smokeless tobacco: Never Used  Substance and Sexual Activity  . Alcohol use: Never  . Drug use: Never  . Sexual activity: Not on file  Other Topics Concern  . Not on file  Social History Narrative  . Not on file   Social Determinants of Health   Financial Resource Strain:   . Difficulty of Paying Living Expenses:   Food Insecurity:   . Worried About Programme researcher, broadcasting/film/video in the Last Year:   . Barista in the Last Year:   Transportation  Needs:   . Freight forwarder (Medical):   Marland Kitchen Lack of Transportation (Non-Medical):   Physical Activity:   . Days of Exercise per Week:   . Minutes of Exercise per Session:   Stress:   . Feeling of Stress :   Social Connections:   . Frequency of Communication with Friends and Family:   . Frequency of Social Gatherings with Friends and Family:   . Attends Religious Services:   . Active Member of Clubs or Organizations:   . Attends Banker Meetings:   Marland Kitchen Marital Status:   Intimate Partner Violence:   . Fear of Current or Ex-Partner:   . Emotionally Abused:   Marland Kitchen Physically Abused:   . Sexually Abused:     Past Medical History, Surgical history, Social history, and Family history were reviewed and updated as appropriate.   Please see review of systems for further details on the patient's review from today.   Objective:   Physical Exam:  There were no vitals taken for this visit.  Physical Exam Constitutional:      General: She is not in acute distress. Musculoskeletal:        General: No deformity.  Neurological:     Mental Status: She is alert and oriented to person, place, and time.     Coordination: Coordination normal.  Psychiatric:        Attention and Perception: Attention and perception normal. She does not perceive auditory or visual hallucinations.        Mood and Affect: Mood is depressed. Mood is not anxious. Affect is not labile, blunt, angry or inappropriate.        Speech: Speech normal.        Behavior: Behavior normal.        Thought Content: Thought content normal. Thought content is not paranoid or delusional. Thought content does not include homicidal or suicidal ideation. Thought content does not include homicidal or suicidal plan.        Cognition and Memory: Cognition and memory normal.        Judgment: Judgment normal.     Comments: Insight intact     Lab Review:     Component Value Date/Time   NA 141 04/05/2018 1341   K 3.5  04/05/2018 1341   CL 104 04/05/2018 1341   CO2 27 04/05/2018 1341   GLUCOSE 92 04/05/2018 1341   BUN 10 04/05/2018 1341   CREATININE 0.65 04/05/2018 1341   CALCIUM 8.9 04/05/2018 1341   PROT 6.5 03/22/2018 1222   ALBUMIN 3.6 03/22/2018 1222   AST 19 03/22/2018 1222   ALT 14 03/22/2018 1222   ALKPHOS 73 03/22/2018 1222   BILITOT 0.6 03/22/2018 1222   GFRNONAA >60 04/05/2018 1341   GFRAA >60 04/05/2018 1341       Component Value Date/Time   WBC 7.7 04/05/2018  1341   RBC 4.34 04/05/2018 1341   HGB 13.5 04/05/2018 1341   HCT 40.0 04/05/2018 1341   PLT 357 04/05/2018 1341   MCV 92.2 04/05/2018 1341   MCH 31.1 04/05/2018 1341   MCHC 33.8 04/05/2018 1341   RDW 13.5 04/05/2018 1341   LYMPHSABS 1.5 03/22/2018 1222   MONOABS 0.4 03/22/2018 1222   EOSABS 0.1 03/22/2018 1222   BASOSABS 0.0 03/22/2018 1222    No results found for: POCLITH, LITHIUM   No results found for: PHENYTOIN, PHENOBARB, VALPROATE, CBMZ   .res Assessment: Plan:    Plan: Adderall XR 30mg  BID  D/C Cymbalta 20mg  daily  Valium 5mg  prn anxiety - uses infrequently Gabapentin 600mg  TID Add Pristiq 50mg  daily - has medications from previous prescription Add Deplin 15mg  daily - samples given  Reviewed genetic testing with pt  Labs - CBC CMP TSH Vit D and B12, folic acid level  Therapist -  Patient advised to contact office with any questions, adverse effects, or acute worsening in signs and symptoms.  Discussed potential benefits, risk, and side effects of benzodiazepines to include potential risk of tolerance and dependence, as well as possible drowsiness.  Advised patient not to drive if experiencing drowsiness and to take lowest possible effective dose to minimize risk of dependence and tolerance.  Discussed potential benefits, risks, and side effects of stimulants with patient to include increased heart rate, palpitations, insomnia, increased anxiety, increased irritability, or decreased  appetite.  Instructed patient to contact office if experiencing any significant tolerability issues.  There are no diagnoses linked to this encounter.   Please see After Visit Summary for patient specific instructions.  No future appointments.  No orders of the defined types were placed in this encounter.   -------------------------------

## 2019-10-12 ENCOUNTER — Ambulatory Visit: Admitting: Adult Health

## 2019-11-08 ENCOUNTER — Other Ambulatory Visit: Payer: Self-pay

## 2019-11-08 ENCOUNTER — Telehealth: Payer: Self-pay | Admitting: Adult Health

## 2019-11-08 DIAGNOSIS — F902 Attention-deficit hyperactivity disorder, combined type: Secondary | ICD-10-CM

## 2019-11-08 MED ORDER — AMPHETAMINE-DEXTROAMPHET ER 30 MG PO CP24: 30.0000 mg | ORAL_CAPSULE | Freq: Two times a day (BID) | ORAL | 0 refills | Status: DC

## 2019-11-08 NOTE — Telephone Encounter (Signed)
Kathryn Henry called to request refill of her adderall.  Made appt for 11/23/19.  Send prescription to [facility] on [address], [place]

## 2019-11-08 NOTE — Telephone Encounter (Signed)
Last refill 10/05/2019, pended for Kathryn Henry Has apt 11/23/2019

## 2019-11-23 ENCOUNTER — Telehealth (INDEPENDENT_AMBULATORY_CARE_PROVIDER_SITE_OTHER): Admitting: Adult Health

## 2019-11-23 ENCOUNTER — Telehealth: Payer: Self-pay | Admitting: Adult Health

## 2019-11-23 ENCOUNTER — Encounter: Payer: Self-pay | Admitting: Adult Health

## 2019-11-23 DIAGNOSIS — G4700 Insomnia, unspecified: Secondary | ICD-10-CM

## 2019-11-23 DIAGNOSIS — F331 Major depressive disorder, recurrent, moderate: Secondary | ICD-10-CM

## 2019-11-23 DIAGNOSIS — F902 Attention-deficit hyperactivity disorder, combined type: Secondary | ICD-10-CM | POA: Diagnosis not present

## 2019-11-23 DIAGNOSIS — F411 Generalized anxiety disorder: Secondary | ICD-10-CM | POA: Diagnosis not present

## 2019-11-23 MED ORDER — DIAZEPAM 5 MG PO TABS: 5.0000 mg | ORAL_TABLET | Freq: Every day | ORAL | 2 refills | Status: DC | PRN

## 2019-11-23 MED ORDER — L-METHYLFOLATE 15 MG PO TABS: 15.0000 mg | ORAL_TABLET | Freq: Every day | ORAL | 5 refills | Status: DC

## 2019-11-23 MED ORDER — DESVENLAFAXINE SUCCINATE ER 100 MG PO TB24: 100.0000 mg | ORAL_TABLET | Freq: Every day | ORAL | 5 refills | Status: DC

## 2019-11-23 MED ORDER — GABAPENTIN 600 MG PO TABS: 600.0000 mg | ORAL_TABLET | Freq: Three times a day (TID) | ORAL | 5 refills | Status: DC

## 2019-11-23 MED ORDER — AMPHETAMINE-DEXTROAMPHET ER 30 MG PO CP24: 30.0000 mg | ORAL_CAPSULE | Freq: Two times a day (BID) | ORAL | 0 refills | Status: DC

## 2019-11-23 NOTE — Telephone Encounter (Signed)
[name], rings are scheduled for a virtual visit with your provider today.    Just as we do with appointments in the office, we must obtain your consent to participate.  Your consent will be active for this visit and any virtual visit you may have with one of our providers in the next 365 days.    If you have a MyChart account, I can also send a copy of this consent to you electronically.  All virtual visits are billed to your insurance company just like a traditional visit in the office.  As this is a virtual visit, video technology does not allow for your provider to perform a traditional examination.  This may limit your provider's ability to fully assess your condition.  If your provider identifies any concerns that need to be evaluated in person or the need to arrange testing such as labs, EKG, etc, we will make arrangements to do so.    Although advances in technology are sophisticated, we cannot ensure that it will always work on either your end or our end.  If the connection with a video visit is poor, we may have to switch to a telephone visit.  With either a video or telephone visit, we are not always able to ensure that we have a secure connection.   I need to obtain your verbal consent now.   Are you willing to proceed with your visit today?   Kathryn Henry has provided verbal consent on 11/23/2019 for a virtual visit (video or telephone).   [name], NP 11/23/2019  9:41 AM

## 2019-11-23 NOTE — Progress Notes (Signed)
Kathryn Henry [id number] [birth year] 34 y.o.  Virtual Visit via Video Note  I connected with pt @ on 11/23/19 at  9:40 AM EDT by a video enabled telemedicine application and verified that I am speaking with the correct person using two identifiers.   I discussed the limitations of evaluation and management by telemedicine and the availability of in person appointments. The patient expressed understanding and agreed to proceed.  I discussed the assessment and treatment plan with the patient. The patient was provided an opportunity to ask questions and all were answered. The patient agreed with the plan and demonstrated an understanding of the instructions.   The patient was advised to call back or seek an in-person evaluation if the symptoms worsen or if the condition fails to improve as anticipated.  I provided 30 minutes of non-face-to-face time during this encounter.  The patient was located at home.  The provider was located at [facility].   [name], NP   Subjective:   Patient ID:  Kathryn Henry is a 34 y.o. (DOB March 26, 1986) female.  Chief Complaint: No chief complaint on file.   HPI [name] presents for follow-up of GAD, MDD, OCD, insomnia, and ADHD.  Describes mood today as "ok". Pleasant. Flat. Mood symptoms - reports decreased depression "it's way better than it was". Has stopped the Cymbalta. Feels like Pristiq has been helpful and she would like to increase dose. Feels anxious at times. Picking at arms - more so at work. Has quit her job and plans to look for another one - "I'm in no rush". Decreased irritability. Mostly staying at home. She and husband working on reconciliation. Seeing daughter regularly. Staying in [place] currently. Taking medications as prescribed.  Energy levels better since addition of Pristiq.  Active, does not have a regular exercise routine.  Enjoys some usual interests and activities. Divorced. Single. Lives alone with  dog. Has a 71 year old daughter. Appetite adequate. Weight stable. Sleeps well most nights. Averages 8 to 9 hours.  Focus and concentration stable. Completing tasks. Managing aspects of household. Work going well.  Denies SI or HI. Denies AH or VH.   Previous medications: Abilify, Celexa, Wellbutrin, Prozac, Latuda, Rexulti, Cogentin, Lamictal-rash  Review of Systems:  Review of Systems  Musculoskeletal: Negative for gait problem.  Neurological: Negative for tremors.  Psychiatric/Behavioral:       Please refer to HPI    Medications: I have reviewed the patient's current medications.  Current Outpatient Medications  Medication Sig Dispense Refill  . Adapalene 0.3 % gel Apply 1 application topically at bedtime.   1  . amitriptyline (ELAVIL) 10 MG tablet Take 20 mg by mouth at bedtime as needed.    Marland Kitchen amphetamine-dextroamphetamine (ADDERALL XR) 30 MG 24 hr capsule Take 1 capsule (30 mg total) by mouth 2 (two) times daily. 60 capsule 0  . [START ON 12/21/2019] amphetamine-dextroamphetamine (ADDERALL XR) 30 MG 24 hr capsule Take 1 capsule (30 mg total) by mouth 2 (two) times daily. 60 capsule 0  . [START ON 01/18/2020] amphetamine-dextroamphetamine (ADDERALL XR) 30 MG 24 hr capsule Take 1 capsule (30 mg total) by mouth 2 (two) times daily. 60 capsule 0  . desvenlafaxine (PRISTIQ) 100 MG 24 hr tablet Take 1 tablet (100 mg total) by mouth daily. 30 tablet 5  . diazepam (VALIUM) 5 MG tablet Take 1 tablet (5 mg total) by mouth daily as needed. 30 tablet 2  . drospirenone-ethinyl estradiol (YAZ) 3-0.02 MG tablet TK 1 T PO QD    .  gabapentin (NEURONTIN) 600 MG tablet Take 1 tablet (600 mg total) by mouth 3 (three) times daily. 90 tablet 5  . JUNEL FE 1/20 1-20 MG-MCG tablet Take 1 tablet by mouth daily.  0  . ketoconazole (NIZORAL) 2 % cream Apply 1 application topically daily.  2  . L-Methylfolate 15 MG TABS Take 1 tablet (15 mg total) by mouth daily. 30 tablet 5  . lamoTRIgine (LAMICTAL) 25 MG  tablet Take one tablet at bedtime x 14 days, then take two tablets at bedtime. 60 tablet 2  . omeprazole (PRILOSEC) 20 MG capsule TK 1 C PO BID    . rizatriptan (MAXALT-MLT) 10 MG disintegrating tablet Take 10 mg by mouth as needed for migraine.   0   No current facility-administered medications for this visit.    Medication Side Effects: None  Allergies: No Known Allergies  No past medical history on file.  No family history on file.  Social History   Socioeconomic History  . Marital status: Married    Spouse name: Not on file  . Number of children: Not on file  . Years of education: Not on file  . Highest education level: Not on file  Occupational History  . Not on file  Tobacco Use  . Smoking status: Former Games developer  . Smokeless tobacco: Never Used  Substance and Sexual Activity  . Alcohol use: Never  . Drug use: Never  . Sexual activity: Not on file  Other Topics Concern  . Not on file  Social History Narrative  . Not on file   Social Determinants of Health   Financial Resource Strain:   . Difficulty of Paying Living Expenses:   Food Insecurity:   . Worried About Programme researcher, broadcasting/film/video in the Last Year:   . Barista in the Last Year:   Transportation Needs:   . Freight forwarder (Medical):   Marland Kitchen Lack of Transportation (Non-Medical):   Physical Activity:   . Days of Exercise per Week:   . Minutes of Exercise per Session:   Stress:   . Feeling of Stress :   Social Connections:   . Frequency of Communication with Friends and Family:   . Frequency of Social Gatherings with Friends and Family:   . Attends Religious Services:   . Active Member of Clubs or Organizations:   . Attends Banker Meetings:   Marland Kitchen Marital Status:   Intimate Partner Violence:   . Fear of Current or Ex-Partner:   . Emotionally Abused:   Marland Kitchen Physically Abused:   . Sexually Abused:     Past Medical History, Surgical history, Social history, and Family history were  reviewed and updated as appropriate.   Please see review of systems for further details on the patient's review from today.   Objective:   Physical Exam:  There were no vitals taken for this visit.  Physical Exam Neurological:     Mental Status: She is alert and oriented to person, place, and time.     Cranial Nerves: No dysarthria.  Psychiatric:        Attention and Perception: Attention and perception normal.        Mood and Affect: Mood normal.        Speech: Speech normal.        Behavior: Behavior is cooperative.        Thought Content: Thought content normal. Thought content is not paranoid or delusional. Thought content does not include homicidal or  suicidal ideation. Thought content does not include homicidal or suicidal plan.        Cognition and Memory: Cognition and memory normal.        Judgment: Judgment normal.     Comments: Insight intact     Lab Review:     Component Value Date/Time   NA 141 04/05/2018 1341   K 3.5 04/05/2018 1341   CL 104 04/05/2018 1341   CO2 27 04/05/2018 1341   GLUCOSE 92 04/05/2018 1341   BUN 10 04/05/2018 1341   CREATININE 0.65 04/05/2018 1341   CALCIUM 8.9 04/05/2018 1341   PROT 6.5 03/22/2018 1222   ALBUMIN 3.6 03/22/2018 1222   AST 19 03/22/2018 1222   ALT 14 03/22/2018 1222   ALKPHOS 73 03/22/2018 1222   BILITOT 0.6 03/22/2018 1222   GFRNONAA >60 04/05/2018 1341   GFRAA >60 04/05/2018 1341       Component Value Date/Time   WBC 7.7 04/05/2018 1341   RBC 4.34 04/05/2018 1341   HGB 13.5 04/05/2018 1341   HCT 40.0 04/05/2018 1341   PLT 357 04/05/2018 1341   MCV 92.2 04/05/2018 1341   MCH 31.1 04/05/2018 1341   MCHC 33.8 04/05/2018 1341   RDW 13.5 04/05/2018 1341   LYMPHSABS 1.5 03/22/2018 1222   MONOABS 0.4 03/22/2018 1222   EOSABS 0.1 03/22/2018 1222   BASOSABS 0.0 03/22/2018 1222    No results found for: POCLITH, LITHIUM   No results found for: PHENYTOIN, PHENOBARB, VALPROATE, CBMZ   .res Assessment: Plan:     Plan: Adderall XR 30mg  BID  Valium 5mg  prn anxiety - uses infrequently Gabapentin 600mg  TID Increase Pristiq 50mg  to 100mg  daily   Deplin 15mg  daily - samples given  Labs - CBC CMP TSH Vit D and B12, folic acid level - all WNL  RTC 3 months  Therapist -  Patient advised to contact office with any questions, adverse effects, or acute worsening in signs and symptoms.  Discussed potential benefits, risk, and side effects of benzodiazepines to include potential risk of tolerance and dependence, as well as possible drowsiness.  Advised patient not to drive if experiencing drowsiness and to take lowest possible effective dose to minimize risk of dependence and tolerance.  Discussed potential benefits, risks, and side effects of stimulants with patient to include increased heart rate, palpitations, insomnia, increased anxiety, increased irritability, or decreased appetite.  Instructed patient to contact office if experiencing any significant tolerability issues.  Diagnoses and all orders for this visit:  Generalized anxiety disorder -     gabapentin (NEURONTIN) 600 MG tablet; Take 1 tablet (600 mg total) by mouth 3 (three) times daily. -     desvenlafaxine (PRISTIQ) 100 MG 24 hr tablet; Take 1 tablet (100 mg total) by mouth daily. -     diazepam (VALIUM) 5 MG tablet; Take 1 tablet (5 mg total) by mouth daily as needed.  Insomnia, unspecified type -     gabapentin (NEURONTIN) 600 MG tablet; Take 1 tablet (600 mg total) by mouth 3 (three) times daily.  Major depressive disorder, recurrent episode, moderate (HCC) -     desvenlafaxine (PRISTIQ) 100 MG 24 hr tablet; Take 1 tablet (100 mg total) by mouth daily. -     L-Methylfolate 15 MG TABS; Take 1 tablet (15 mg total) by mouth daily.  Attention deficit hyperactivity disorder (ADHD), combined type -     amphetamine-dextroamphetamine (ADDERALL XR) 30 MG 24 hr capsule; Take 1 capsule (30 mg total) by  mouth 2 (two) times daily. -      amphetamine-dextroamphetamine (ADDERALL XR) 30 MG 24 hr capsule; Take 1 capsule (30 mg total) by mouth 2 (two) times daily. -     amphetamine-dextroamphetamine (ADDERALL XR) 30 MG 24 hr capsule; Take 1 capsule (30 mg total) by mouth 2 (two) times daily.     Please see After Visit Summary for patient specific instructions.  No future appointments.  No orders of the defined types were placed in this encounter.     -------------------------------

## 2020-05-12 ENCOUNTER — Other Ambulatory Visit: Payer: Self-pay | Admitting: Adult Health

## 2020-05-12 DIAGNOSIS — G4700 Insomnia, unspecified: Secondary | ICD-10-CM

## 2020-05-12 DIAGNOSIS — F411 Generalized anxiety disorder: Secondary | ICD-10-CM

## 2020-05-12 DIAGNOSIS — F331 Major depressive disorder, recurrent, moderate: Secondary | ICD-10-CM

## 2020-05-13 NOTE — Telephone Encounter (Signed)
Last apt was 11/2019

## 2020-05-30 ENCOUNTER — Telehealth: Payer: Self-pay | Admitting: Adult Health

## 2020-05-30 NOTE — Telephone Encounter (Signed)
Patient needs a refill on Adderall 30 mg. Call to [facility] in [place] on [facility] [address]. Next appt  06/17/20

## 2020-05-31 ENCOUNTER — Other Ambulatory Visit: Payer: Self-pay

## 2020-05-31 DIAGNOSIS — F902 Attention-deficit hyperactivity disorder, combined type: Secondary | ICD-10-CM

## 2020-05-31 MED ORDER — AMPHETAMINE-DEXTROAMPHET ER 30 MG PO CP24: 30.0000 mg | ORAL_CAPSULE | Freq: Two times a day (BID) | ORAL | 0 refills | Status: DC

## 2020-05-31 NOTE — Telephone Encounter (Signed)
Last refill 02/01/20 Amphetamine-dextroamphetamine XR 30 mg #60 Next apt 06/17/20 Pended Rx for Kathryn Henry to review and sign

## 2020-06-17 ENCOUNTER — Encounter: Payer: Self-pay | Admitting: Adult Health

## 2020-06-17 ENCOUNTER — Telehealth (INDEPENDENT_AMBULATORY_CARE_PROVIDER_SITE_OTHER): Admitting: Adult Health

## 2020-06-17 DIAGNOSIS — F411 Generalized anxiety disorder: Secondary | ICD-10-CM | POA: Diagnosis not present

## 2020-06-17 DIAGNOSIS — F331 Major depressive disorder, recurrent, moderate: Secondary | ICD-10-CM

## 2020-06-17 DIAGNOSIS — F902 Attention-deficit hyperactivity disorder, combined type: Secondary | ICD-10-CM | POA: Diagnosis not present

## 2020-06-17 DIAGNOSIS — G4700 Insomnia, unspecified: Secondary | ICD-10-CM | POA: Diagnosis not present

## 2020-06-17 MED ORDER — AMPHETAMINE-DEXTROAMPHET ER 30 MG PO CP24: 30.0000 mg | ORAL_CAPSULE | Freq: Two times a day (BID) | ORAL | 0 refills | Status: DC

## 2020-06-17 MED ORDER — DIAZEPAM 5 MG PO TABS: 5.0000 mg | ORAL_TABLET | Freq: Three times a day (TID) | ORAL | 2 refills | Status: DC | PRN

## 2020-06-17 MED ORDER — DESVENLAFAXINE SUCCINATE ER 100 MG PO TB24: ORAL_TABLET | ORAL | 5 refills | Status: DC

## 2020-06-17 MED ORDER — GABAPENTIN 600 MG PO TABS: ORAL_TABLET | ORAL | 5 refills | Status: DC

## 2020-06-17 MED ORDER — DESVENLAFAXINE SUCCINATE ER 50 MG PO TB24: 50.0000 mg | ORAL_TABLET | Freq: Every day | ORAL | 5 refills | Status: DC

## 2020-06-17 NOTE — Progress Notes (Signed)
Kathryn Henry [id number] Sep 26, 1985 34 y.o.  Virtual Visit via Telephone Note  I connected with pt on 06/17/20 at  8:00 AM EST by telephone and verified that I am speaking with the correct person using two identifiers.   I discussed the limitations, risks, security and privacy concerns of performing an evaluation and management service by telephone and the availability of in person appointments. I also discussed with the patient that there may be a patient responsible charge related to this service. The patient expressed understanding and agreed to proceed.   I discussed the assessment and treatment plan with the patient. The patient was provided an opportunity to ask questions and all were answered. The patient agreed with the plan and demonstrated an understanding of the instructions.   The patient was advised to call back or seek an in-person evaluation if the symptoms worsen or if the condition fails to improve as anticipated.  I provided 30 minutes of non-face-to-face time during this encounter.  The patient was located at home.  The provider was located at [facility].   [name], NP   Subjective:   Patient ID:  Kathryn Henry is a 34 y.o. (DOB 1986/03/18) female.  Chief Complaint: No chief complaint on file.   HPI Kathryn Henry presents for follow-up of GAD, MDD, OCD, insomnia, and ADHD.  Describes mood today as "ok". Pleasant. Mood symptoms - reports depression, anxiety, and irritability. More anxious overall. Stating "I'm still doing better than I was". Increased anxiety with husband's upcoming deployment to [place]. He leaves the day after Christmas for 6 months. She and daughter will remain in [place]. Would like to increase Pristiq to 150mg  and Valium 5mg  daily to TID. Stable interest and motivation. Taking medications as prescribed.  Energy levels stable. Active, does not have a regular exercise routine.  Enjoys some usual interests and  activities. Married. Lives alone with husband, 27 year old daughter and one dog. Appetite adequate. Weight stable . Sleeps well most nights. Averages 8 to 9 hours.  Focus and concentration stable. Completing tasks. Managing aspects of household. Unemployed. Denies SI or HI.  Denies AH or VH.   Previous medications: Abilify, Celexa, Wellbutrin, Prozac, Latuda, Rexulti, Cogentin, Lamictal-rash  Review of Systems:  Review of Systems  Musculoskeletal: Negative for gait problem.  Neurological: Negative for tremors.  Psychiatric/Behavioral:       Please refer to HPI    Medications: I have reviewed the patient's current medications.  Current Outpatient Medications  Medication Sig Dispense Refill  . Adapalene 0.3 % gel Apply 1 application topically at bedtime.   1  . amphetamine-dextroamphetamine (ADDERALL XR) 30 MG 24 hr capsule Take 1 capsule (30 mg total) by mouth 2 (two) times daily. 60 capsule 0  . [START ON 07/15/2020] amphetamine-dextroamphetamine (ADDERALL XR) 30 MG 24 hr capsule Take 1 capsule (30 mg total) by mouth 2 (two) times daily. 60 capsule 0  . [START ON 08/12/2020] amphetamine-dextroamphetamine (ADDERALL XR) 30 MG 24 hr capsule Take 1 capsule (30 mg total) by mouth 2 (two) times daily. 60 capsule 0  . desvenlafaxine (PRISTIQ) 100 MG 24 hr tablet TAKE 1 TABLET(100 MG) BY MOUTH DAILY 30 tablet 5  . desvenlafaxine (PRISTIQ) 50 MG 24 hr tablet Take 1 tablet (50 mg total) by mouth daily. 30 tablet 5  . diazepam (VALIUM) 5 MG tablet Take 1 tablet (5 mg total) by mouth 3 (three) times daily as needed. 90 tablet 2  . drospirenone-ethinyl estradiol (YAZ) 3-0.02 MG tablet TK  1 T PO QD    . gabapentin (NEURONTIN) 600 MG tablet TAKE 1 TABLET(600 MG) BY MOUTH THREE TIMES DAILY 90 tablet 5  . JUNEL FE 1/20 1-20 MG-MCG tablet Take 1 tablet by mouth daily.  0  . ketoconazole (NIZORAL) 2 % cream Apply 1 application topically daily.  2  . omeprazole (PRILOSEC) 20 MG capsule TK 1 C PO BID    .  rizatriptan (MAXALT-MLT) 10 MG disintegrating tablet Take 10 mg by mouth as needed for migraine.   0   No current facility-administered medications for this visit.    Medication Side Effects: None  Allergies: No Known Allergies  No past medical history on file.  No family history on file.  Social History   Socioeconomic History  . Marital status: Married    Spouse name: Not on file  . Number of children: Not on file  . Years of education: Not on file  . Highest education level: Not on file  Occupational History  . Not on file  Tobacco Use  . Smoking status: Former Games developer  . Smokeless tobacco: Never Used  Vaping Use  . Vaping Use: Never used  Substance and Sexual Activity  . Alcohol use: Never  . Drug use: Never  . Sexual activity: Not on file  Other Topics Concern  . Not on file  Social History Narrative  . Not on file   Social Determinants of Health   Financial Resource Strain:   . Difficulty of Paying Living Expenses: Not on file  Food Insecurity:   . Worried About Programme researcher, broadcasting/film/video in the Last Year: Not on file  . Ran Out of Food in the Last Year: Not on file  Transportation Needs:   . Lack of Transportation (Medical): Not on file  . Lack of Transportation (Non-Medical): Not on file  Physical Activity:   . Days of Exercise per Week: Not on file  . Minutes of Exercise per Session: Not on file  Stress:   . Feeling of Stress : Not on file  Social Connections:   . Frequency of Communication with Friends and Family: Not on file  . Frequency of Social Gatherings with Friends and Family: Not on file  . Attends Religious Services: Not on file  . Active Member of Clubs or Organizations: Not on file  . Attends Banker Meetings: Not on file  . Marital Status: Not on file  Intimate Partner Violence:   . Fear of Current or Ex-Partner: Not on file  . Emotionally Abused: Not on file  . Physically Abused: Not on file  . Sexually Abused: Not on file     Past Medical History, Surgical history, Social history, and Family history were reviewed and updated as appropriate.   Please see review of systems for further details on the patient's review from today.   Objective:   Physical Exam:  There were no vitals taken for this visit.  Physical Exam Neurological:     Mental Status: She is alert and oriented to person, place, and time.     Cranial Nerves: No dysarthria.  Psychiatric:        Attention and Perception: Attention and perception normal.        Mood and Affect: Mood normal.        Speech: Speech normal.        Behavior: Behavior is cooperative.        Thought Content: Thought content normal. Thought content is not paranoid or  delusional. Thought content does not include homicidal or suicidal ideation. Thought content does not include homicidal or suicidal plan.        Cognition and Memory: Cognition and memory normal.        Judgment: Judgment normal.     Comments: Insight intact     Lab Review:     Component Value Date/Time   NA 141 04/05/2018 1341   K 3.5 04/05/2018 1341   CL 104 04/05/2018 1341   CO2 27 04/05/2018 1341   GLUCOSE 92 04/05/2018 1341   BUN 10 04/05/2018 1341   CREATININE 0.65 04/05/2018 1341   CALCIUM 8.9 04/05/2018 1341   PROT 6.5 03/22/2018 1222   ALBUMIN 3.6 03/22/2018 1222   AST 19 03/22/2018 1222   ALT 14 03/22/2018 1222   ALKPHOS 73 03/22/2018 1222   BILITOT 0.6 03/22/2018 1222   GFRNONAA >60 04/05/2018 1341   GFRAA >60 04/05/2018 1341       Component Value Date/Time   WBC 7.7 04/05/2018 1341   RBC 4.34 04/05/2018 1341   HGB 13.5 04/05/2018 1341   HCT 40.0 04/05/2018 1341   PLT 357 04/05/2018 1341   MCV 92.2 04/05/2018 1341   MCH 31.1 04/05/2018 1341   MCHC 33.8 04/05/2018 1341   RDW 13.5 04/05/2018 1341   LYMPHSABS 1.5 03/22/2018 1222   MONOABS 0.4 03/22/2018 1222   EOSABS 0.1 03/22/2018 1222   BASOSABS 0.0 03/22/2018 1222    No results found for: POCLITH, LITHIUM   No  results found for: PHENYTOIN, PHENOBARB, VALPROATE, CBMZ   .res Assessment: Plan:    Plan: Adderall XR 30mg  BID  Increase Valium 5mg  daily to TID. Gabapentin 600mg  TID Increase Pristiq 100mg  to 150mg  daily   Deplin 15mg  daily - taking OT  RTC 3 months  Patient advised to contact office with any questions, adverse effects, or acute worsening in signs and symptoms.  Discussed potential benefits, risk, and side effects of benzodiazepines to include potential risk of tolerance and dependence, as well as possible drowsiness.  Advised patient not to drive if experiencing drowsiness and to take lowest possible effective dose to minimize risk of dependence and tolerance.  Discussed potential benefits, risks, and side effects of stimulants with patient to include increased heart rate, palpitations, insomnia, increased anxiety, increased irritability, or decreased appetite.  Instructed patient to contact office if experiencing any significant tolerability issues.    Diagnoses and all orders for this visit:  Generalized anxiety disorder -     diazepam (VALIUM) 5 MG tablet; Take 1 tablet (5 mg total) by mouth 3 (three) times daily as needed. -     desvenlafaxine (PRISTIQ) 100 MG 24 hr tablet; TAKE 1 TABLET(100 MG) BY MOUTH DAILY -     gabapentin (NEURONTIN) 600 MG tablet; TAKE 1 TABLET(600 MG) BY MOUTH THREE TIMES DAILY  Attention deficit hyperactivity disorder (ADHD), combined type -     amphetamine-dextroamphetamine (ADDERALL XR) 30 MG 24 hr capsule; Take 1 capsule (30 mg total) by mouth 2 (two) times daily. -     amphetamine-dextroamphetamine (ADDERALL XR) 30 MG 24 hr capsule; Take 1 capsule (30 mg total) by mouth 2 (two) times daily. -     amphetamine-dextroamphetamine (ADDERALL XR) 30 MG 24 hr capsule; Take 1 capsule (30 mg total) by mouth 2 (two) times daily.  Major depressive disorder, recurrent episode, moderate (HCC) -     desvenlafaxine (PRISTIQ) 100 MG 24 hr tablet; TAKE 1  TABLET(100 MG) BY MOUTH DAILY -  desvenlafaxine (PRISTIQ) 50 MG 24 hr tablet; Take 1 tablet (50 mg total) by mouth daily.  Insomnia, unspecified type -     gabapentin (NEURONTIN) 600 MG tablet; TAKE 1 TABLET(600 MG) BY MOUTH THREE TIMES DAILY    Please see After Visit Summary for patient specific instructions.  No future appointments.  No orders of the defined types were placed in this encounter.     -------------------------------

## 2020-10-08 ENCOUNTER — Other Ambulatory Visit: Payer: Self-pay | Admitting: Adult Health

## 2020-10-08 DIAGNOSIS — F411 Generalized anxiety disorder: Secondary | ICD-10-CM

## 2020-10-08 NOTE — Telephone Encounter (Signed)
Please review

## 2020-10-23 ENCOUNTER — Telehealth: Payer: Self-pay | Admitting: Adult Health

## 2020-10-23 NOTE — Telephone Encounter (Signed)
Patient has schedule an appt on 4/22. She is requesting refills on the Adderall and Diazepam. Fill at [id number], [place] - [address] [facility] [address] [facility] [address] Deniece [address]ela [address]  [address], [place] [address]  Phone:  [phone] Fax:  [phone]

## 2020-10-28 ENCOUNTER — Other Ambulatory Visit: Payer: Self-pay

## 2020-10-28 DIAGNOSIS — F902 Attention-deficit hyperactivity disorder, combined type: Secondary | ICD-10-CM

## 2020-10-28 DIAGNOSIS — F411 Generalized anxiety disorder: Secondary | ICD-10-CM

## 2020-10-28 MED ORDER — DIAZEPAM 5 MG PO TABS
ORAL_TABLET | ORAL | 0 refills | Status: DC
Start: 2020-10-28 — End: 2020-11-01

## 2020-10-28 MED ORDER — AMPHETAMINE-DEXTROAMPHET ER 30 MG PO CP24: 30.0000 mg | ORAL_CAPSULE | Freq: Two times a day (BID) | ORAL | 0 refills | Status: DC

## 2020-10-28 NOTE — Telephone Encounter (Signed)
Script sent  

## 2020-10-28 NOTE — Telephone Encounter (Signed)
Please review

## 2020-10-28 NOTE — Telephone Encounter (Signed)
Pended.

## 2020-10-28 NOTE — Telephone Encounter (Signed)
Ok to pend.

## 2020-10-28 NOTE — Telephone Encounter (Signed)
Kathryn Henry called back in today with refill request for Adderall 30mg  and diazepam 5mg . Pharmacy [facility] [address]

## 2020-10-30 ENCOUNTER — Telehealth: Payer: Self-pay | Admitting: Adult Health

## 2020-10-30 ENCOUNTER — Other Ambulatory Visit: Payer: Self-pay

## 2020-10-30 DIAGNOSIS — F902 Attention-deficit hyperactivity disorder, combined type: Secondary | ICD-10-CM

## 2020-10-30 MED ORDER — AMPHETAMINE-DEXTROAMPHET ER 30 MG PO CP24: 30.0000 mg | ORAL_CAPSULE | Freq: Two times a day (BID) | ORAL | 0 refills | Status: DC

## 2020-10-30 NOTE — Telephone Encounter (Signed)
pended

## 2020-10-30 NOTE — Telephone Encounter (Signed)
Ok to pend.

## 2020-10-30 NOTE — Telephone Encounter (Signed)
Please review

## 2020-10-30 NOTE — Telephone Encounter (Signed)
Script sent  

## 2020-10-30 NOTE — Telephone Encounter (Signed)
Rx for Adderall that is at [facility] on [address] in [place]-  [facility] there is unable to get Adderall in for her. She asks if we can send it to: [facility] [address] [place], [place] instead

## 2020-11-01 ENCOUNTER — Telehealth (INDEPENDENT_AMBULATORY_CARE_PROVIDER_SITE_OTHER): Admitting: Adult Health

## 2020-11-01 DIAGNOSIS — F331 Major depressive disorder, recurrent, moderate: Secondary | ICD-10-CM | POA: Diagnosis not present

## 2020-11-01 DIAGNOSIS — G4700 Insomnia, unspecified: Secondary | ICD-10-CM

## 2020-11-01 DIAGNOSIS — F411 Generalized anxiety disorder: Secondary | ICD-10-CM | POA: Diagnosis not present

## 2020-11-01 DIAGNOSIS — F902 Attention-deficit hyperactivity disorder, combined type: Secondary | ICD-10-CM | POA: Diagnosis not present

## 2020-11-01 MED ORDER — AMPHETAMINE-DEXTROAMPHET ER 30 MG PO CP24: 30.0000 mg | ORAL_CAPSULE | Freq: Two times a day (BID) | ORAL | 0 refills | Status: DC

## 2020-11-01 MED ORDER — GABAPENTIN 600 MG PO TABS: ORAL_TABLET | ORAL | 5 refills | Status: DC

## 2020-11-01 MED ORDER — DEPLIN 15 15-90.314 MG PO CAPS: 15.0000 mg | ORAL_CAPSULE | Freq: Every day | ORAL | 3 refills | Status: DC

## 2020-11-01 MED ORDER — DIAZEPAM 5 MG PO TABS: ORAL_TABLET | ORAL | 0 refills | Status: DC

## 2020-11-01 MED ORDER — DESVENLAFAXINE SUCCINATE ER 100 MG PO TB24: ORAL_TABLET | ORAL | 5 refills | Status: DC

## 2020-11-01 NOTE — Progress Notes (Signed)
Kathryn Henry [id number] 15-Jul-1985 35 y.o.  Virtual Visit via Telephone Note  I connected with pt on 11/01/20 at  3:00 PM EDT by telephone and verified that I am speaking with the correct person using two identifiers.   I discussed the limitations, risks, security and privacy concerns of performing an evaluation and management service by telephone and the availability of in person appointments. I also discussed with the patient that there may be a patient responsible charge related to this service. The patient expressed understanding and agreed to proceed.   I discussed the assessment and treatment plan with the patient. The patient was provided an opportunity to ask questions and all were answered. The patient agreed with the plan and demonstrated an understanding of the instructions.   The patient was advised to call back or seek an in-person evaluation if the symptoms worsen or if the condition fails to improve as anticipated.  I provided 30  minutes of non-face-to-face time during this encounter.  The patient was located at home.  The provider was located at [facility].   [name], NP   Subjective:   Patient ID:  Kathryn Henry is a 35 y.o. (DOB [birth year]) female.  Chief Complaint: No chief complaint on file.   HPI Kathryn Henry presents for follow-up of GAD, MDD, OCD, insomnia, and ADHD.  Describes mood today as "ok". Pleasant. Mood symptoms - denies depression, anxiety, and irritability. Stating "I'm doing good". Husband deployed to [place]. She and daughter doing well. Has started to roller skate. Feels like medications continue to work well. Stable interest and motivation. Taking medications as prescribed.  Energy levels stable. Active, has a regular exercise routine.  Enjoys some usual interests and activities. Married. Lives with husband and daughter and one dog. Appetite adequate. Weight stable - 150 pounds - 66". Sleeps well most nights.  Averages 8 to 9 hours.  Focus and concentration stable. Completing tasks. Managing aspects of household. Unemployed. Denies SI or HI.  Denies AH or VH.   Previous medications: Abilify, Celexa, Wellbutrin, Prozac, Latuda, Rexulti, Cogentin, Lamictal-rash    Review of Systems:  Review of Systems  Musculoskeletal: Negative for gait problem.  Neurological: Negative for tremors.  Psychiatric/Behavioral:       Please refer to HPI    Medications: I have reviewed the patient's current medications.  Current Outpatient Medications  Medication Sig Dispense Refill  . L-Methylfolate-Algae (DEPLIN 15) 15-90.314 MG CAPS Take 15 mg by mouth daily. 90 capsule 3  . Adapalene 0.3 % gel Apply 1 application topically at bedtime.   1  . amphetamine-dextroamphetamine (ADDERALL XR) 30 MG 24 hr capsule Take 1 capsule (30 mg total) by mouth 2 (two) times daily. 60 capsule 0  . [START ON 11/29/2020] amphetamine-dextroamphetamine (ADDERALL XR) 30 MG 24 hr capsule Take 1 capsule (30 mg total) by mouth 2 (two) times daily. 60 capsule 0  . [START ON 12/27/2020] amphetamine-dextroamphetamine (ADDERALL XR) 30 MG 24 hr capsule Take 1 capsule (30 mg total) by mouth 2 (two) times daily. 60 capsule 0  . desvenlafaxine (PRISTIQ) 100 MG 24 hr tablet TAKE 1 TABLET(100 MG) BY MOUTH DAILY 30 tablet 5  . diazepam (VALIUM) 5 MG tablet TAKE 1 TABLET(5 MG) BY MOUTH THREE TIMES DAILY AS NEEDED 90 tablet 0  . drospirenone-ethinyl estradiol (YAZ) 3-0.02 MG tablet TK 1 T PO QD    . gabapentin (NEURONTIN) 600 MG tablet TAKE 1 TABLET(600 MG) BY MOUTH THREE TIMES DAILY 90 tablet 5  . JUNEL  FE 1/20 1-20 MG-MCG tablet Take 1 tablet by mouth daily.  0  . ketoconazole (NIZORAL) 2 % cream Apply 1 application topically daily.  2  . omeprazole (PRILOSEC) 20 MG capsule TK 1 C PO BID    . rizatriptan (MAXALT-MLT) 10 MG disintegrating tablet Take 10 mg by mouth as needed for migraine.   0   No current facility-administered medications for this  visit.    Medication Side Effects: None  Allergies: No Known Allergies  No past medical history on file.  No family history on file.  Social History   Socioeconomic History  . Marital status: Married    Spouse name: Not on file  . Number of children: Not on file  . Years of education: Not on file  . Highest education level: Not on file  Occupational History  . Not on file  Tobacco Use  . Smoking status: Former Games developer  . Smokeless tobacco: Never Used  Vaping Use  . Vaping Use: Never used  Substance and Sexual Activity  . Alcohol use: Never  . Drug use: Never  . Sexual activity: Not on file  Other Topics Concern  . Not on file  Social History Narrative  . Not on file   Social Determinants of Health   Financial Resource Strain: Not on file  Food Insecurity: Not on file  Transportation Needs: Not on file  Physical Activity: Not on file  Stress: Not on file  Social Connections: Not on file  Intimate Partner Violence: Not on file    Past Medical History, Surgical history, Social history, and Family history were reviewed and updated as appropriate.   Please see review of systems for further details on the patient's review from today.   Objective:   Physical Exam:  There were no vitals taken for this visit.  Physical Exam Constitutional:      General: She is not in acute distress. Musculoskeletal:        General: No deformity.  Neurological:     Mental Status: She is alert and oriented to person, place, and time.     Coordination: Coordination normal.  Psychiatric:        Attention and Perception: Attention and perception normal. She does not perceive auditory or visual hallucinations.        Mood and Affect: Mood normal. Mood is not anxious or depressed. Affect is not labile, blunt, angry or inappropriate.        Speech: Speech normal.        Behavior: Behavior normal.        Thought Content: Thought content normal. Thought content is not paranoid or  delusional. Thought content does not include homicidal or suicidal ideation. Thought content does not include homicidal or suicidal plan.        Cognition and Memory: Cognition and memory normal.        Judgment: Judgment normal.     Comments: Insight intact     Lab Review:     Component Value Date/Time   NA 141 04/05/2018 1341   K 3.5 04/05/2018 1341   CL 104 04/05/2018 1341   CO2 27 04/05/2018 1341   GLUCOSE 92 04/05/2018 1341   BUN 10 04/05/2018 1341   CREATININE 0.65 04/05/2018 1341   CALCIUM 8.9 04/05/2018 1341   PROT 6.5 03/22/2018 1222   ALBUMIN 3.6 03/22/2018 1222   AST 19 03/22/2018 1222   ALT 14 03/22/2018 1222   ALKPHOS 73 03/22/2018 1222   BILITOT 0.6 03/22/2018  1222   GFRNONAA >60 04/05/2018 1341   GFRAA >60 04/05/2018 1341       Component Value Date/Time   WBC 7.7 04/05/2018 1341   RBC 4.34 04/05/2018 1341   HGB 13.5 04/05/2018 1341   HCT 40.0 04/05/2018 1341   PLT 357 04/05/2018 1341   MCV 92.2 04/05/2018 1341   MCH 31.1 04/05/2018 1341   MCHC 33.8 04/05/2018 1341   RDW 13.5 04/05/2018 1341   LYMPHSABS 1.5 03/22/2018 1222   MONOABS 0.4 03/22/2018 1222   EOSABS 0.1 03/22/2018 1222   BASOSABS 0.0 03/22/2018 1222    No results found for: POCLITH, LITHIUM   No results found for: PHENYTOIN, PHENOBARB, VALPROATE, CBMZ   .res Assessment: Plan:     Plan: Adderall XR 30mg  BID  Increase Valium 5mg  daily to TID. Gabapentin 600mg  TID Decrease Pristiq 150mg  to 100mg  daily   Deplin 15mg  daily - will send through [facility]  RTC 3 months   Time spent with patient was 30 minutes. Greater than 50% of face to face time with patient was spent on counseling and coordination of care.   Patient advised to contact office with any questions, adverse effects, or acute worsening in signs and symptoms.  Discussed potential benefits, risk, and side effects of benzodiazepines to include potential risk of tolerance and dependence, as well as possible drowsiness.   Advised patient not to drive if experiencing drowsiness and to take lowest possible effective dose to minimize risk of dependence and tolerance.  Discussed potential benefits, risks, and side effects of stimulants with patient to include increased heart rate, palpitations, insomnia, increased anxiety, increased irritability, or decreased appetite.  Instructed patient to contact office if experiencing any significant tolerability issues.    Diagnoses and all orders for this visit:  Major depressive disorder, recurrent episode, moderate (HCC) -     L-Methylfolate-Algae (DEPLIN 15) 15-90.314 MG CAPS; Take 15 mg by mouth daily. -     desvenlafaxine (PRISTIQ) 100 MG 24 hr tablet; TAKE 1 TABLET(100 MG) BY MOUTH DAILY  Attention deficit hyperactivity disorder (ADHD), combined type -     amphetamine-dextroamphetamine (ADDERALL XR) 30 MG 24 hr capsule; Take 1 capsule (30 mg total) by mouth 2 (two) times daily. -     amphetamine-dextroamphetamine (ADDERALL XR) 30 MG 24 hr capsule; Take 1 capsule (30 mg total) by mouth 2 (two) times daily. -     amphetamine-dextroamphetamine (ADDERALL XR) 30 MG 24 hr capsule; Take 1 capsule (30 mg total) by mouth 2 (two) times daily.  Generalized anxiety disorder -     desvenlafaxine (PRISTIQ) 100 MG 24 hr tablet; TAKE 1 TABLET(100 MG) BY MOUTH DAILY -     diazepam (VALIUM) 5 MG tablet; TAKE 1 TABLET(5 MG) BY MOUTH THREE TIMES DAILY AS NEEDED -     gabapentin (NEURONTIN) 600 MG tablet; TAKE 1 TABLET(600 MG) BY MOUTH THREE TIMES DAILY  Insomnia, unspecified type -     gabapentin (NEURONTIN) 600 MG tablet; TAKE 1 TABLET(600 MG) BY MOUTH THREE TIMES DAILY    Please see After Visit Summary for patient specific instructions.  No future appointments.  No orders of the defined types were placed in this encounter.     -------------------------------

## 2021-01-23 ENCOUNTER — Other Ambulatory Visit: Payer: Self-pay

## 2021-01-23 DIAGNOSIS — F411 Generalized anxiety disorder: Secondary | ICD-10-CM

## 2021-01-23 MED ORDER — DIAZEPAM 5 MG PO TABS: ORAL_TABLET | ORAL | 0 refills | Status: DC

## 2021-02-26 ENCOUNTER — Telehealth: Payer: Self-pay | Admitting: Adult Health

## 2021-02-26 ENCOUNTER — Other Ambulatory Visit: Payer: Self-pay

## 2021-02-26 DIAGNOSIS — F411 Generalized anxiety disorder: Secondary | ICD-10-CM

## 2021-02-26 DIAGNOSIS — F902 Attention-deficit hyperactivity disorder, combined type: Secondary | ICD-10-CM

## 2021-02-26 NOTE — Telephone Encounter (Signed)
Pt made an appt  and needs a refill on her valium and adderall sent to [facility] on [address] in [place]

## 2021-02-26 NOTE — Telephone Encounter (Signed)
Pended.

## 2021-02-28 MED ORDER — DIAZEPAM 5 MG PO TABS: ORAL_TABLET | ORAL | 0 refills | Status: DC

## 2021-02-28 MED ORDER — AMPHETAMINE-DEXTROAMPHET ER 30 MG PO CP24: 30.0000 mg | ORAL_CAPSULE | Freq: Two times a day (BID) | ORAL | 0 refills | Status: DC

## 2021-03-14 ENCOUNTER — Encounter: Payer: Self-pay | Admitting: Adult Health

## 2021-03-14 ENCOUNTER — Telehealth (INDEPENDENT_AMBULATORY_CARE_PROVIDER_SITE_OTHER): Admitting: Adult Health

## 2021-03-14 DIAGNOSIS — F411 Generalized anxiety disorder: Secondary | ICD-10-CM

## 2021-03-14 DIAGNOSIS — F902 Attention-deficit hyperactivity disorder, combined type: Secondary | ICD-10-CM | POA: Diagnosis not present

## 2021-03-14 DIAGNOSIS — F331 Major depressive disorder, recurrent, moderate: Secondary | ICD-10-CM

## 2021-03-14 DIAGNOSIS — G4700 Insomnia, unspecified: Secondary | ICD-10-CM

## 2021-03-14 MED ORDER — FLUOXETINE HCL 20 MG PO CAPS: 20.0000 mg | ORAL_CAPSULE | Freq: Every day | ORAL | 2 refills | Status: DC

## 2021-03-14 MED ORDER — DIAZEPAM 5 MG PO TABS: ORAL_TABLET | ORAL | 2 refills | Status: DC

## 2021-03-14 MED ORDER — AMPHETAMINE-DEXTROAMPHET ER 30 MG PO CP24: 30.0000 mg | ORAL_CAPSULE | Freq: Two times a day (BID) | ORAL | 0 refills | Status: DC

## 2021-03-14 MED ORDER — DESVENLAFAXINE SUCCINATE ER 100 MG PO TB24: ORAL_TABLET | ORAL | 5 refills | Status: DC

## 2021-03-14 MED ORDER — GABAPENTIN 600 MG PO TABS: ORAL_TABLET | ORAL | 5 refills | Status: DC

## 2021-03-14 NOTE — Progress Notes (Signed)
[name] [id number] 04/28/1986 35 y.o.  Virtual Visit via Video Note  I connected with pt @ on 03/14/21 at 12:00 PM EDT by a video enabled telemedicine application and verified that I am speaking with the correct person using two identifiers.   I discussed the limitations of evaluation and management by telemedicine and the availability of in person appointments. The patient expressed understanding and agreed to proceed.  I discussed the assessment and treatment plan with the patient. The patient was provided an opportunity to ask questions and all were answered. The patient agreed with the plan and demonstrated an understanding of the instructions.   The patient was advised to call back or seek an in-person evaluation if the symptoms worsen or if the condition fails to improve as anticipated.  I provided 25 minutes of non-face-to-face time during this encounter.  The patient was located at home.  The provider was located at [facility].   [name], NP   Subjective:   Patient ID:  Kathryn Henry is a 35 y.o. (DOB Jun 28, 1986) female.  Chief Complaint: No chief complaint on file.   HPI [name] presents for follow-up of GAD, MDD, OCD, insomnia, and ADHD.  Describes mood today as "ok". Pleasant. Mood symptoms - reports decreased depression, anxiety, and irritability. Stating "I'm doing alright". Feels like she still needs a "little kick" emotionally. Would like to restart Prozac - "that was helpful for me". Family doing well. Daughter has returned to school. Stable interest and motivation. Taking medications as prescribed.  Energy levels stable. Active, has a regular exercise routine.  Enjoys some usual interests and activities. Married. Lives with husband and daughter and one dog. Appetite adequate. Weight gain 10 pounds - 160 pounds - 66". Sleeps well most nights. Averages 8 hours.  Focus and concentration stable. Completing tasks. Managing aspects of  household. Unemployed. Denies SI or HI.  Denies AH or VH.   Previous medications: Abilify, Celexa, Wellbutrin, Prozac, Latuda, Rexulti, Cogentin, Lamictal-rash  Review of Systems:  Review of Systems  Musculoskeletal:  Negative for gait problem.  Neurological:  Negative for tremors.  Psychiatric/Behavioral:         Please refer to HPI   Medications: I have reviewed the patient's current medications.  Current Outpatient Medications  Medication Sig Dispense Refill   FLUoxetine (PROZAC) 20 MG capsule Take 1 capsule (20 mg total) by mouth daily. 30 capsule 2   Adapalene 0.3 % gel Apply 1 application topically at bedtime.   1   amphetamine-dextroamphetamine (ADDERALL XR) 30 MG 24 hr capsule Take 1 capsule (30 mg total) by mouth 2 (two) times daily. 60 capsule 0   [START ON 04/11/2021] amphetamine-dextroamphetamine (ADDERALL XR) 30 MG 24 hr capsule Take 1 capsule (30 mg total) by mouth 2 (two) times daily. 60 capsule 0   [START ON 05/09/2021] amphetamine-dextroamphetamine (ADDERALL XR) 30 MG 24 hr capsule Take 1 capsule (30 mg total) by mouth 2 (two) times daily. 60 capsule 0   desvenlafaxine (PRISTIQ) 100 MG 24 hr tablet TAKE 1 TABLET(100 MG) BY MOUTH DAILY 30 tablet 5   diazepam (VALIUM) 5 MG tablet TAKE 1 TABLET(5 MG) BY MOUTH THREE TIMES DAILY AS NEEDED 90 tablet 2   drospirenone-ethinyl estradiol (YAZ) 3-0.02 MG tablet TK 1 T PO QD     gabapentin (NEURONTIN) 600 MG tablet TAKE 1 TABLET(600 MG) BY MOUTH FOUR TIMES DAILY 120 tablet 5   JUNEL FE 1/20 1-20 MG-MCG tablet Take 1 tablet by mouth daily.  0  ketoconazole (NIZORAL) 2 % cream Apply 1 application topically daily.  2   omeprazole (PRILOSEC) 20 MG capsule TK 1 C PO BID     rizatriptan (MAXALT-MLT) 10 MG disintegrating tablet Take 10 mg by mouth as needed for migraine.   0   No current facility-administered medications for this visit.    Medication Side Effects: None  Allergies: No Known Allergies  No past medical history on  file.  No family history on file.  Social History   Socioeconomic History   Marital status: Married    Spouse name: Not on file   Number of children: Not on file   Years of education: Not on file   Highest education level: Not on file  Occupational History   Not on file  Tobacco Use   Smoking status: Former   Smokeless tobacco: Never  Vaping Use   Vaping Use: Never used  Substance and Sexual Activity   Alcohol use: Never   Drug use: Never   Sexual activity: Not on file  Other Topics Concern   Not on file  Social History Narrative   Not on file   Social Determinants of Health   Financial Resource Strain: Not on file  Food Insecurity: Not on file  Transportation Needs: Not on file  Physical Activity: Not on file  Stress: Not on file  Social Connections: Not on file  Intimate Partner Violence: Not on file    Past Medical History, Surgical history, Social history, and Family history were reviewed and updated as appropriate.   Please see review of systems for further details on the patient's review from today.   Objective:   Physical Exam:  There were no vitals taken for this visit.  Physical Exam Constitutional:      General: She is not in acute distress. Musculoskeletal:        General: No deformity.  Neurological:     Mental Status: She is alert and oriented to person, place, and time.     Coordination: Coordination normal.  Psychiatric:        Attention and Perception: Attention and perception normal. She does not perceive auditory or visual hallucinations.        Mood and Affect: Mood normal. Mood is not anxious or depressed. Affect is not labile, blunt, angry or inappropriate.        Speech: Speech normal.        Behavior: Behavior normal.        Thought Content: Thought content normal. Thought content is not paranoid or delusional. Thought content does not include homicidal or suicidal ideation. Thought content does not include homicidal or suicidal  plan.        Cognition and Memory: Cognition and memory normal.        Judgment: Judgment normal.     Comments: Insight intact    Lab Review:     Component Value Date/Time   NA 141 04/05/2018 1341   K 3.5 04/05/2018 1341   CL 104 04/05/2018 1341   CO2 27 04/05/2018 1341   GLUCOSE 92 04/05/2018 1341   BUN 10 04/05/2018 1341   CREATININE 0.65 04/05/2018 1341   CALCIUM 8.9 04/05/2018 1341   PROT 6.5 03/22/2018 1222   ALBUMIN 3.6 03/22/2018 1222   AST 19 03/22/2018 1222   ALT 14 03/22/2018 1222   ALKPHOS 73 03/22/2018 1222   BILITOT 0.6 03/22/2018 1222   GFRNONAA >60 04/05/2018 1341   GFRAA >60 04/05/2018 1341  Component Value Date/Time   WBC 7.7 04/05/2018 1341   RBC 4.34 04/05/2018 1341   HGB 13.5 04/05/2018 1341   HCT 40.0 04/05/2018 1341   PLT 357 04/05/2018 1341   MCV 92.2 04/05/2018 1341   MCH 31.1 04/05/2018 1341   MCHC 33.8 04/05/2018 1341   RDW 13.5 04/05/2018 1341   LYMPHSABS 1.5 03/22/2018 1222   MONOABS 0.4 03/22/2018 1222   EOSABS 0.1 03/22/2018 1222   BASOSABS 0.0 03/22/2018 1222    No results found for: POCLITH, LITHIUM   No results found for: PHENYTOIN, PHENOBARB, VALPROATE, CBMZ   .res Assessment: Plan:    Plan:  Adderall XR 30mg  BID  Valium 5mg  daily to TID - using for sleep. Increase Gabapentin 600mg  TID to 4 times daily - RLS Pristiq 100mg  daily   Add Prozac 20mg  daily Deplin 15mg  daily - will send through [employer]  RTC 3 months  Time spent with patient was 30 minutes. Greater than 50% of face to face time with patient was spent on counseling and coordination of care.   Patient advised to contact office with any questions, adverse effects, or acute worsening in signs and symptoms.  Discussed potential benefits, risk, and side effects of benzodiazepines to include potential risk of tolerance and dependence, as well as possible drowsiness.  Advised patient not to drive if experiencing drowsiness and to take lowest possible  effective dose to minimize risk of dependence and tolerance.  Discussed potential benefits, risks, and side effects of stimulants with patient to include increased heart rate, palpitations, insomnia, increased anxiety, increased irritability, or decreased appetite.  Instructed patient to contact office if experiencing any significant tolerability issues.  Diagnoses and all orders for this visit:  Attention deficit hyperactivity disorder (ADHD), combined type -     amphetamine-dextroamphetamine (ADDERALL XR) 30 MG 24 hr capsule; Take 1 capsule (30 mg total) by mouth 2 (two) times daily. -     amphetamine-dextroamphetamine (ADDERALL XR) 30 MG 24 hr capsule; Take 1 capsule (30 mg total) by mouth 2 (two) times daily. -     amphetamine-dextroamphetamine (ADDERALL XR) 30 MG 24 hr capsule; Take 1 capsule (30 mg total) by mouth 2 (two) times daily.  Major depressive disorder, recurrent episode, moderate (HCC) -     FLUoxetine (PROZAC) 20 MG capsule; Take 1 capsule (20 mg total) by mouth daily. -     desvenlafaxine (PRISTIQ) 100 MG 24 hr tablet; TAKE 1 TABLET(100 MG) BY MOUTH DAILY  Generalized anxiety disorder -     FLUoxetine (PROZAC) 20 MG capsule; Take 1 capsule (20 mg total) by mouth daily. -     desvenlafaxine (PRISTIQ) 100 MG 24 hr tablet; TAKE 1 TABLET(100 MG) BY MOUTH DAILY -     diazepam (VALIUM) 5 MG tablet; TAKE 1 TABLET(5 MG) BY MOUTH THREE TIMES DAILY AS NEEDED -     gabapentin (NEURONTIN) 600 MG tablet; TAKE 1 TABLET(600 MG) BY MOUTH FOUR TIMES DAILY  Insomnia, unspecified type -     gabapentin (NEURONTIN) 600 MG tablet; TAKE 1 TABLET(600 MG) BY MOUTH FOUR TIMES DAILY    Please see After Visit Summary for patient specific instructions.  No future appointments.   No orders of the defined types were placed in this encounter.     -------------------------------

## 2021-05-23 ENCOUNTER — Other Ambulatory Visit: Payer: Self-pay | Admitting: Adult Health

## 2021-05-23 DIAGNOSIS — F411 Generalized anxiety disorder: Secondary | ICD-10-CM

## 2021-05-23 NOTE — Telephone Encounter (Signed)
Pt has an appt 12/12

## 2021-06-12 ENCOUNTER — Other Ambulatory Visit: Payer: Self-pay | Admitting: Adult Health

## 2021-06-12 DIAGNOSIS — F411 Generalized anxiety disorder: Secondary | ICD-10-CM

## 2021-06-12 DIAGNOSIS — F331 Major depressive disorder, recurrent, moderate: Secondary | ICD-10-CM

## 2021-06-23 ENCOUNTER — Telehealth (INDEPENDENT_AMBULATORY_CARE_PROVIDER_SITE_OTHER): Admitting: Adult Health

## 2021-06-23 ENCOUNTER — Encounter: Payer: Self-pay | Admitting: Adult Health

## 2021-06-23 DIAGNOSIS — G4700 Insomnia, unspecified: Secondary | ICD-10-CM

## 2021-06-23 DIAGNOSIS — F331 Major depressive disorder, recurrent, moderate: Secondary | ICD-10-CM

## 2021-06-23 DIAGNOSIS — F411 Generalized anxiety disorder: Secondary | ICD-10-CM

## 2021-06-23 DIAGNOSIS — F902 Attention-deficit hyperactivity disorder, combined type: Secondary | ICD-10-CM

## 2021-06-23 MED ORDER — AMPHETAMINE-DEXTROAMPHET ER 30 MG PO CP24: 30.0000 mg | ORAL_CAPSULE | Freq: Two times a day (BID) | ORAL | 0 refills | Status: DC

## 2021-06-23 MED ORDER — GABAPENTIN 600 MG PO TABS: ORAL_TABLET | ORAL | 5 refills | Status: DC

## 2021-06-23 MED ORDER — FLUOXETINE HCL 40 MG PO CAPS: ORAL_CAPSULE | ORAL | 5 refills | Status: DC

## 2021-06-23 MED ORDER — DIAZEPAM 5 MG PO TABS: ORAL_TABLET | ORAL | 2 refills | Status: DC

## 2021-06-23 MED ORDER — DESVENLAFAXINE SUCCINATE ER 100 MG PO TB24: ORAL_TABLET | ORAL | 5 refills | Status: DC

## 2021-06-23 NOTE — Progress Notes (Signed)
Kathryn Henry [id number] Jan 11, 1986 35 y.o.  Virtual Visit via Video Note  I connected with pt @ on 06/23/21 at  9:40 AM EST by a video enabled telemedicine application and verified that I am speaking with the correct person using two identifiers.   I discussed the limitations of evaluation and management by telemedicine and the availability of in person appointments. The patient expressed understanding and agreed to proceed.  I discussed the assessment and treatment plan with the patient. The patient was provided an opportunity to ask questions and all were answered. The patient agreed with the plan and demonstrated an understanding of the instructions.   The patient was advised to call back or seek an in-person evaluation if the symptoms worsen or if the condition fails to improve as anticipated.  I provided 25 minutes of non-face-to-face time during this encounter.  The patient was located at home.  The provider was located at [facility].   [name], NP   Subjective:   Patient ID:  Kathryn Henry is a 35 y.o. (DOB [birth year]) female.  Chief Complaint: No chief complaint on file.   HPI Joshua Zeringue presents for follow-up of GAD, MDD, OCD, insomnia, and ADHD.  Describes mood today as "ok". Pleasant. Denies tearfulness. Mood symptoms - reports decreased depression, anxiety, and irritability. Feels like the Prozac has been helpful, but would like to increase from 20 to 40mg  daily. Stating "I'm doing ok". Recovering from a week long migraine. Family doing well. Stable interest and motivation. Taking medications as prescribed.  Energy levels stable. Active, has a regular exercise routine.  Enjoys some usual interests and activities. Married. Lives with husband and daughter and one dog. Appetite adequate. Weight stable - 160 pounds - 66". Sleeps well most nights. Averages 8 hours.  Focus and concentration stable. Completing tasks. Managing aspects of household.  Stay at home mom. Denies SI or HI.  Denies AH or VH.   Previous medications: Abilify, Celexa, Wellbutrin, Prozac, Latuda, Rexulti, Cogentin, Lamictal-rash    Review of Systems:  Review of Systems  Musculoskeletal:  Negative for gait problem.  Neurological:  Negative for tremors.  Psychiatric/Behavioral:         Please refer to HPI   Medications: I have reviewed the patient's current medications.  Current Outpatient Medications  Medication Sig Dispense Refill   Adapalene 0.3 % gel Apply 1 application topically at bedtime.   1   amphetamine-dextroamphetamine (ADDERALL XR) 30 MG 24 hr capsule Take 1 capsule (30 mg total) by mouth 2 (two) times daily. 60 capsule 0   [START ON 07/21/2021] amphetamine-dextroamphetamine (ADDERALL XR) 30 MG 24 hr capsule Take 1 capsule (30 mg total) by mouth 2 (two) times daily. 60 capsule 0   [START ON 08/18/2021] amphetamine-dextroamphetamine (ADDERALL XR) 30 MG 24 hr capsule Take 1 capsule (30 mg total) by mouth 2 (two) times daily. 60 capsule 0   desvenlafaxine (PRISTIQ) 100 MG 24 hr tablet TAKE 1 TABLET(100 MG) BY MOUTH DAILY 30 tablet 5   diazepam (VALIUM) 5 MG tablet TAKE 1 TABLET(5 MG) BY MOUTH THREE TIMES DAILY AS NEEDED 90 tablet 2   drospirenone-ethinyl estradiol (YAZ) 3-0.02 MG tablet TK 1 T PO QD     FLUoxetine (PROZAC) 40 MG capsule TAKE 1 CAPSULE(20 MG) BY MOUTH DAILY 30 capsule 5   gabapentin (NEURONTIN) 600 MG tablet TAKE 1 TABLET(600 MG) BY MOUTH FOUR TIMES DAILY 120 tablet 5   JUNEL FE 1/20 1-20 MG-MCG tablet Take 1 tablet by mouth daily.  0   ketoconazole (NIZORAL) 2 % cream Apply 1 application topically daily.  2   omeprazole (PRILOSEC) 20 MG capsule TK 1 C PO BID     rizatriptan (MAXALT-MLT) 10 MG disintegrating tablet Take 10 mg by mouth as needed for migraine.   0   No current facility-administered medications for this visit.    Medication Side Effects: None  Allergies: No Known Allergies  No past medical history on file.  No  family history on file.  Social History   Socioeconomic History   Marital status: Married    Spouse name: Not on file   Number of children: Not on file   Years of education: Not on file   Highest education level: Not on file  Occupational History   Not on file  Tobacco Use   Smoking status: Former   Smokeless tobacco: Never  Vaping Use   Vaping Use: Never used  Substance and Sexual Activity   Alcohol use: Never   Drug use: Never   Sexual activity: Not on file  Other Topics Concern   Not on file  Social History Narrative   Not on file   Social Determinants of Health   Financial Resource Strain: Not on file  Food Insecurity: Not on file  Transportation Needs: Not on file  Physical Activity: Not on file  Stress: Not on file  Social Connections: Not on file  Intimate Partner Violence: Not on file    Past Medical History, Surgical history, Social history, and Family history were reviewed and updated as appropriate.   Please see review of systems for further details on the patient's review from today.   Objective:   Physical Exam:  There were no vitals taken for this visit.  Physical Exam Constitutional:      General: She is not in acute distress. Musculoskeletal:        General: No deformity.  Neurological:     Mental Status: She is alert and oriented to person, place, and time.     Coordination: Coordination normal.  Psychiatric:        Attention and Perception: Attention and perception normal. She does not perceive auditory or visual hallucinations.        Mood and Affect: Mood normal. Mood is not anxious or depressed. Affect is not labile, blunt, angry or inappropriate.        Speech: Speech normal.        Behavior: Behavior normal.        Thought Content: Thought content normal. Thought content is not paranoid or delusional. Thought content does not include homicidal or suicidal ideation. Thought content does not include homicidal or suicidal plan.         Cognition and Memory: Cognition and memory normal.        Judgment: Judgment normal.     Comments: Insight intact    Lab Review:     Component Value Date/Time   NA 141 04/05/2018 1341   K 3.5 04/05/2018 1341   CL 104 04/05/2018 1341   CO2 27 04/05/2018 1341   GLUCOSE 92 04/05/2018 1341   BUN 10 04/05/2018 1341   CREATININE 0.65 04/05/2018 1341   CALCIUM 8.9 04/05/2018 1341   PROT 6.5 03/22/2018 1222   ALBUMIN 3.6 03/22/2018 1222   AST 19 03/22/2018 1222   ALT 14 03/22/2018 1222   ALKPHOS 73 03/22/2018 1222   BILITOT 0.6 03/22/2018 1222   GFRNONAA >60 04/05/2018 1341   GFRAA >60 04/05/2018 1341  Component Value Date/Time   WBC 7.7 04/05/2018 1341   RBC 4.34 04/05/2018 1341   HGB 13.5 04/05/2018 1341   HCT 40.0 04/05/2018 1341   PLT 357 04/05/2018 1341   MCV 92.2 04/05/2018 1341   MCH 31.1 04/05/2018 1341   MCHC 33.8 04/05/2018 1341   RDW 13.5 04/05/2018 1341   LYMPHSABS 1.5 03/22/2018 1222   MONOABS 0.4 03/22/2018 1222   EOSABS 0.1 03/22/2018 1222   BASOSABS 0.0 03/22/2018 1222    No results found for: POCLITH, LITHIUM   No results found for: PHENYTOIN, PHENOBARB, VALPROATE, CBMZ   .res Assessment: Plan:    Plan:  Adderall XR 30mg  BID  Valium 5mg  TID - using as needed Gabapentin 600mg  4 times daily - RLS Pristiq 100mg  daily   Increase Prozac 20 to 40mg  daily  Deplin 15mg  daily - will send through [employer]  RTC 3 months  Time spent with patient was 30 minutes. Greater than 50% of face to face time with patient was spent on counseling and coordination of care.   Patient advised to contact office with any questions, adverse effects, or acute worsening in signs and symptoms.  Discussed potential benefits, risk, and side effects of benzodiazepines to include potential risk of tolerance and dependence, as well as possible drowsiness.  Advised patient not to drive if experiencing drowsiness and to take lowest possible effective dose to minimize risk  of dependence and tolerance.  Discussed potential benefits, risks, and side effects of stimulants with patient to include increased heart rate, palpitations, insomnia, increased anxiety, increased irritability, or decreased appetite.  Instructed patient to contact office if experiencing any significant tolerability issues.  Diagnoses and all orders for this visit:  Attention deficit hyperactivity disorder (ADHD), combined type -     amphetamine-dextroamphetamine (ADDERALL XR) 30 MG 24 hr capsule; Take 1 capsule (30 mg total) by mouth 2 (two) times daily. -     amphetamine-dextroamphetamine (ADDERALL XR) 30 MG 24 hr capsule; Take 1 capsule (30 mg total) by mouth 2 (two) times daily. -     amphetamine-dextroamphetamine (ADDERALL XR) 30 MG 24 hr capsule; Take 1 capsule (30 mg total) by mouth 2 (two) times daily.  Major depressive disorder, recurrent episode, moderate (HCC) -     desvenlafaxine (PRISTIQ) 100 MG 24 hr tablet; TAKE 1 TABLET(100 MG) BY MOUTH DAILY -     FLUoxetine (PROZAC) 40 MG capsule; TAKE 1 CAPSULE(20 MG) BY MOUTH DAILY  Generalized anxiety disorder -     desvenlafaxine (PRISTIQ) 100 MG 24 hr tablet; TAKE 1 TABLET(100 MG) BY MOUTH DAILY -     diazepam (VALIUM) 5 MG tablet; TAKE 1 TABLET(5 MG) BY MOUTH THREE TIMES DAILY AS NEEDED -     FLUoxetine (PROZAC) 40 MG capsule; TAKE 1 CAPSULE(20 MG) BY MOUTH DAILY -     gabapentin (NEURONTIN) 600 MG tablet; TAKE 1 TABLET(600 MG) BY MOUTH FOUR TIMES DAILY  Insomnia, unspecified type -     gabapentin (NEURONTIN) 600 MG tablet; TAKE 1 TABLET(600 MG) BY MOUTH FOUR TIMES DAILY    Please see After Visit Summary for patient specific instructions.  No future appointments.   No orders of the defined types were placed in this encounter.     -------------------------------

## 2021-06-24 ENCOUNTER — Telehealth: Admitting: Physician Assistant

## 2021-06-24 DIAGNOSIS — B9689 Other specified bacterial agents as the cause of diseases classified elsewhere: Secondary | ICD-10-CM

## 2021-06-24 DIAGNOSIS — J019 Acute sinusitis, unspecified: Secondary | ICD-10-CM | POA: Diagnosis not present

## 2021-06-24 MED ORDER — AMOXICILLIN-POT CLAVULANATE 875-125 MG PO TABS: 1.0000 | ORAL_TABLET | Freq: Two times a day (BID) | ORAL | 0 refills | Status: DC

## 2021-06-24 NOTE — Patient Instructions (Signed)
Kathryn Henry, thank you for joining [name], PA-C for today's virtual visit.  While this provider is not your primary care provider (PCP), if your PCP is located in our provider database this encounter information will be shared with them immediately following your visit.  Consent: (Patient) Kathryn Henry provided verbal consent for this virtual visit at the beginning of the encounter.  Current Medications:  Current Outpatient Medications:    Adapalene 0.3 % gel, Apply 1 application topically at bedtime. , Disp: , Rfl: 1   amphetamine-dextroamphetamine (ADDERALL XR) 30 MG 24 hr capsule, Take 1 capsule (30 mg total) by mouth 2 (two) times daily., Disp: 60 capsule, Rfl: 0   [START ON 07/21/2021] amphetamine-dextroamphetamine (ADDERALL XR) 30 MG 24 hr capsule, Take 1 capsule (30 mg total) by mouth 2 (two) times daily., Disp: 60 capsule, Rfl: 0   [START ON 08/18/2021] amphetamine-dextroamphetamine (ADDERALL XR) 30 MG 24 hr capsule, Take 1 capsule (30 mg total) by mouth 2 (two) times daily., Disp: 60 capsule, Rfl: 0   desvenlafaxine (PRISTIQ) 100 MG 24 hr tablet, TAKE 1 TABLET(100 MG) BY MOUTH DAILY, Disp: 30 tablet, Rfl: 5   diazepam (VALIUM) 5 MG tablet, TAKE 1 TABLET(5 MG) BY MOUTH THREE TIMES DAILY AS NEEDED, Disp: 90 tablet, Rfl: 2   drospirenone-ethinyl estradiol (YAZ) 3-0.02 MG tablet, TK 1 T PO QD, Disp: , Rfl:    FLUoxetine (PROZAC) 40 MG capsule, TAKE 1 CAPSULE(20 MG) BY MOUTH DAILY, Disp: 30 capsule, Rfl: 5   gabapentin (NEURONTIN) 600 MG tablet, TAKE 1 TABLET(600 MG) BY MOUTH FOUR TIMES DAILY, Disp: 120 tablet, Rfl: 5   JUNEL FE 1/20 1-20 MG-MCG tablet, Take 1 tablet by mouth daily., Disp: , Rfl: 0   ketoconazole (NIZORAL) 2 % cream, Apply 1 application topically daily., Disp: , Rfl: 2   omeprazole (PRILOSEC) 20 MG capsule, TK 1 C PO BID, Disp: , Rfl:    rizatriptan (MAXALT-MLT) 10 MG disintegrating tablet, Take 10 mg by mouth as needed for migraine. , Disp: , Rfl: 0    Medications ordered in this encounter:  No orders of the defined types were placed in this encounter.    *If you need refills on other medications prior to your next appointment, please contact your pharmacy*  Follow-Up: Call back or seek an in-person evaluation if the symptoms worsen or if the condition fails to improve as anticipated.  Other Instructions Please take antibiotic as directed.  Increase fluid intake.  Use Saline nasal spray.  Take a daily multivitamin. Continue Flonase and your other OTC medications.  Place a humidifier in the bedroom.  Please call or return clinic if symptoms are not improving.  Sinusitis Sinusitis is redness, soreness, and swelling (inflammation) of the paranasal sinuses. Paranasal sinuses are air pockets within the bones of your face (beneath the eyes, the middle of the forehead, or above the eyes). In healthy paranasal sinuses, mucus is able to drain out, and air is able to circulate through them by way of your nose. However, when your paranasal sinuses are inflamed, mucus and air can become trapped. This can allow bacteria and other germs to grow and cause infection. Sinusitis can develop quickly and last only a short time (acute) or continue over a long period (chronic). Sinusitis that lasts for more than 12 weeks is considered chronic.  CAUSES  Causes of sinusitis include: Allergies. Structural abnormalities, such as displacement of the cartilage that separates your nostrils (deviated septum), which can decrease the air flow through your  nose and sinuses and affect sinus drainage. Functional abnormalities, such as when the small hairs (cilia) that line your sinuses and help remove mucus do not work properly or are not present. SYMPTOMS  Symptoms of acute and chronic sinusitis are the same. The primary symptoms are pain and pressure around the affected sinuses. Other symptoms include: Upper toothache. Earache. Headache. Bad breath. Decreased sense  of smell and taste. A cough, which worsens when you are lying flat. Fatigue. Fever. Thick drainage from your nose, which often is green and may contain pus (purulent). Swelling and warmth over the affected sinuses. DIAGNOSIS  Your caregiver will perform a physical exam. During the exam, your caregiver may: Look in your nose for signs of abnormal growths in your nostrils (nasal polyps). Tap over the affected sinus to check for signs of infection. View the inside of your sinuses (endoscopy) with a special imaging device with a light attached (endoscope), which is inserted into your sinuses. If your caregiver suspects that you have chronic sinusitis, one or more of the following tests may be recommended: Allergy tests. Nasal culture A sample of mucus is taken from your nose and sent to a lab and screened for bacteria. Nasal cytology A sample of mucus is taken from your nose and examined by your caregiver to determine if your sinusitis is related to an allergy. TREATMENT  Most cases of acute sinusitis are related to a viral infection and will resolve on their own within 10 days. Sometimes medicines are prescribed to help relieve symptoms (pain medicine, decongestants, nasal steroid sprays, or saline sprays).  However, for sinusitis related to a bacterial infection, your caregiver will prescribe antibiotic medicines. These are medicines that will help kill the bacteria causing the infection.  Rarely, sinusitis is caused by a fungal infection. In theses cases, your caregiver will prescribe antifungal medicine. For some cases of chronic sinusitis, surgery is needed. Generally, these are cases in which sinusitis recurs more than 3 times per year, despite other treatments. HOME CARE INSTRUCTIONS  Drink plenty of water. Water helps thin the mucus so your sinuses can drain more easily. Use a humidifier. Inhale steam 3 to 4 times a day (for example, sit in the bathroom with the shower running). Apply a  warm, moist washcloth to your face 3 to 4 times a day, or as directed by your caregiver. Use saline nasal sprays to help moisten and clean your sinuses. Take over-the-counter or prescription medicines for pain, discomfort, or fever only as directed by your caregiver. SEEK IMMEDIATE MEDICAL CARE IF: You have increasing pain or severe headaches. You have nausea, vomiting, or drowsiness. You have swelling around your face. You have vision problems. You have a stiff neck. You have difficulty breathing. MAKE SURE YOU:  Understand these instructions. Will watch your condition. Will get help right away if you are not doing well or get worse. Document Released: 06/29/2005 Document Revised: 09/21/2011 Document Reviewed: 07/14/2011 [facility] Patient Information 2014 [place], [place].    If you have been instructed to have an in-person evaluation today at a local Urgent Care facility, please use the link below. It will take you to a list of all of our available [facility], including address, phone number and hours of operation. Please do not delay care.  [facility]  If you or a family member do not have a primary care provider, use the link below to schedule a visit and establish care. When you choose a [facility] primary care  physician or advanced practice provider, you gain a long-term partner in health. Find a Primary Care Provider  Learn more about [facility] in-office and virtual care options: [facility]

## 2021-06-24 NOTE — Progress Notes (Signed)
Virtual Visit Consent   Kathryn Henry, you are scheduled for a virtual visit with a [facility] provider today.     Just as with appointments in the office, your consent must be obtained to participate.  Your consent will be active for this visit and any virtual visit you may have with one of our providers in the next 365 days.     If you have a MyChart account, a copy of this consent can be sent to you electronically.  All virtual visits are billed to your insurance company just like a traditional visit in the office.    As this is a virtual visit, video technology does not allow for your provider to perform a traditional examination.  This may limit your provider's ability to fully assess your condition.  If your provider identifies any concerns that need to be evaluated in person or the need to arrange testing (such as labs, EKG, etc.), we will make arrangements to do so.     Although advances in technology are sophisticated, we cannot ensure that it will always work on either your end or our end.  If the connection with a video visit is poor, the visit may have to be switched to a telephone visit.  With either a video or telephone visit, we are not always able to ensure that we have a secure connection.     I need to obtain your verbal consent now.   Are you willing to proceed with your visit today?    Kathryn Henry has provided verbal consent on 06/24/2021 for a virtual visit (video or telephone).   Kathryn Henry, [place]   Date: 06/24/2021 4:52 PM   Virtual Visit via Video Note   I, Kathryn Henry, connected with  Kathryn Henry  ([id number], July 26, 1985) on 06/24/21 at  4:50 PM EST by a video-enabled telemedicine application and verified that I am speaking with the correct person using two identifiers.  Location: Patient: Virtual Visit Location Patient: Home Provider: Virtual Visit Location Provider: Home Office   I discussed the limitations of evaluation and  management by telemedicine and the availability of in person appointments. The patient expressed understanding and agreed to proceed.    History of Present Illness: Kathryn Henry is a 35 y.o. who identifies as a female who was assigned female at birth, and is being seen today for possible sinusitis. She endorses 1.5 weeks of maxillary sinus pressure and sinus pain associated with continued nasal/head congestion. Denies fever, chills or aches. Denies recent travel or sick contact. Has been taking Sudafed which helps slightly.   HPI: HPI  Problems:  Patient Active Problem List   Diagnosis Date Noted   GAD (generalized anxiety disorder) 02/27/2019   MDD (major depressive disorder) 02/27/2019   Insomnia, unspecified 02/27/2019   ADD (attention deficit disorder) 04/28/2018    Allergies: No Known Allergies Medications:  Current Outpatient Medications:    amitriptyline (ELAVIL) 50 MG tablet, , Disp: , Rfl:    Adapalene 0.3 % gel, Apply 1 application topically at bedtime. , Disp: , Rfl: 1   amphetamine-dextroamphetamine (ADDERALL XR) 30 MG 24 hr capsule, Take 1 capsule (30 mg total) by mouth 2 (two) times daily., Disp: 60 capsule, Rfl: 0   [START ON 07/21/2021] amphetamine-dextroamphetamine (ADDERALL XR) 30 MG 24 hr capsule, Take 1 capsule (30 mg total) by mouth 2 (two) times daily., Disp: 60 capsule, Rfl: 0   [START ON 08/18/2021] amphetamine-dextroamphetamine (ADDERALL XR) 30 MG 24 hr capsule, Take 1  capsule (30 mg total) by mouth 2 (two) times daily., Disp: 60 capsule, Rfl: 0   desvenlafaxine (PRISTIQ) 100 MG 24 hr tablet, TAKE 1 TABLET(100 MG) BY MOUTH DAILY, Disp: 30 tablet, Rfl: 5   diazepam (VALIUM) 5 MG tablet, TAKE 1 TABLET(5 MG) BY MOUTH THREE TIMES DAILY AS NEEDED, Disp: 90 tablet, Rfl: 2   drospirenone-ethinyl estradiol (YAZ) 3-0.02 MG tablet, TK 1 T PO QD, Disp: , Rfl:    FLUoxetine (PROZAC) 40 MG capsule, TAKE 1 CAPSULE(20 MG) BY MOUTH DAILY, Disp: 30 capsule, Rfl: 5   gabapentin  (NEURONTIN) 600 MG tablet, TAKE 1 TABLET(600 MG) BY MOUTH FOUR TIMES DAILY, Disp: 120 tablet, Rfl: 5   JUNEL FE 1/20 1-20 MG-MCG tablet, Take 1 tablet by mouth daily., Disp: , Rfl: 0   ketoconazole (NIZORAL) 2 % cream, Apply 1 application topically daily., Disp: , Rfl: 2   omeprazole (PRILOSEC) 20 MG capsule, TK 1 C PO BID, Disp: , Rfl:   Observations/Objective: Patient is well-developed, well-nourished in no acute distress.  Resting comfortably at home.  Head is normocephalic, atraumatic.  No labored breathing. Speech is clear and coherent with logical content.  Patient is alert and oriented at baseline.   Assessment and Plan: 1. Acute bacterial sinusitis Rx Augmentin BID x 7 days.  Increase fluids.  Rest.  Saline nasal spray.  Probiotic.  Mucinex as directed.  Humidifier in bedroom. Continue Flonase and OTC decongestants .  Call or return to clinic if symptoms are not improving.   Follow Up Instructions: I discussed the assessment and treatment plan with the patient. The patient was provided an opportunity to ask questions and all were answered. The patient agreed with the plan and demonstrated an understanding of the instructions.  A copy of instructions were sent to the patient via MyChart unless otherwise noted below.   The patient was advised to call back or seek an in-person evaluation if the symptoms worsen or if the condition fails to improve as anticipated.  Time:  I spent 10 minutes with the patient via telehealth technology discussing the above problems/concerns.    Kathryn Climes, PA-C

## 2021-07-11 ENCOUNTER — Other Ambulatory Visit: Payer: Self-pay | Admitting: Adult Health

## 2021-07-11 DIAGNOSIS — F331 Major depressive disorder, recurrent, moderate: Secondary | ICD-10-CM

## 2021-07-11 DIAGNOSIS — F411 Generalized anxiety disorder: Secondary | ICD-10-CM

## 2021-07-21 ENCOUNTER — Telehealth: Payer: Self-pay | Admitting: Adult Health

## 2021-07-21 ENCOUNTER — Other Ambulatory Visit: Payer: Self-pay

## 2021-07-21 MED ORDER — LISDEXAMFETAMINE DIMESYLATE 40 MG PO CAPS: 40.0000 mg | ORAL_CAPSULE | ORAL | 0 refills | Status: DC

## 2021-07-21 NOTE — Telephone Encounter (Signed)
40 mg

## 2021-07-21 NOTE — Telephone Encounter (Signed)
Pended.

## 2021-07-21 NOTE — Telephone Encounter (Signed)
We can but it will more than likely require a PA.

## 2021-07-21 NOTE — Telephone Encounter (Signed)
Pt called and said that she can't get the adderall anywhere near where she lives. However the [facility] has all mg of vyvanse in stock. She would like you to send a script  of vyvanse to [facility] on [address] in [place]

## 2021-07-21 NOTE — Telephone Encounter (Signed)
I will let her know,what dose should I pend?

## 2021-07-21 NOTE — Telephone Encounter (Signed)
Please review

## 2021-07-23 ENCOUNTER — Telehealth: Payer: Self-pay

## 2021-07-23 NOTE — Telephone Encounter (Signed)
PA submitted for vyvanse 40 mg capsules through [facility] on cover my meds.Awaiting response.

## 2021-07-25 ENCOUNTER — Telehealth: Payer: Self-pay

## 2021-07-25 NOTE — Telephone Encounter (Unsigned)
Prior Authorization submitted and initially denied for VYVYANSE 40 MG but I resubmitted and received an approval effective 06/25/2021-07/12/2098 Case [id number].

## 2021-07-28 NOTE — Telephone Encounter (Signed)
Noted  

## 2021-08-21 ENCOUNTER — Other Ambulatory Visit: Payer: Self-pay

## 2021-08-21 ENCOUNTER — Telehealth: Payer: Self-pay | Admitting: Adult Health

## 2021-08-21 MED ORDER — LISDEXAMFETAMINE DIMESYLATE 40 MG PO CAPS: 40.0000 mg | ORAL_CAPSULE | ORAL | 0 refills | Status: DC

## 2021-08-21 NOTE — Telephone Encounter (Signed)
Pended.

## 2021-08-21 NOTE — Telephone Encounter (Signed)
Pt called requesting Rx for Vyvanse 40 mg 1/d to [facility] [address] [place]

## 2021-09-16 ENCOUNTER — Telehealth: Admitting: Nurse Practitioner

## 2021-09-16 ENCOUNTER — Telehealth: Admitting: Physician Assistant

## 2021-09-16 DIAGNOSIS — N3000 Acute cystitis without hematuria: Secondary | ICD-10-CM

## 2021-09-16 MED ORDER — CEPHALEXIN 500 MG PO CAPS: 500.0000 mg | ORAL_CAPSULE | Freq: Two times a day (BID) | ORAL | 0 refills | Status: AC

## 2021-09-16 MED ORDER — PHENAZOPYRIDINE HCL 95 MG PO TABS: 95.0000 mg | ORAL_TABLET | Freq: Three times a day (TID) | ORAL | 0 refills | Status: DC | PRN

## 2021-09-16 NOTE — Progress Notes (Signed)
?Virtual Visit Consent  ? ?Kathryn Henry, you are scheduled for a virtual visit with a [facility] provider today.   ?  ?Just as with appointments in the office, your consent must be obtained to participate.  Your consent will be active for this visit and any virtual visit you may have with one of our providers in the next 365 days.   ?  ?If you have a MyChart account, a copy of this consent can be sent to you electronically.  All virtual visits are billed to your insurance company just like a traditional visit in the office.   ? ?As this is a virtual visit, video technology does not allow for your provider to perform a traditional examination.  This may limit your provider's ability to fully assess your condition.  If your provider identifies any concerns that need to be evaluated in person or the need to arrange testing (such as labs, EKG, etc.), we will make arrangements to do so.   ?  ?Although advances in technology are sophisticated, we cannot ensure that it will always work on either your end or our end.  If the connection with a video visit is poor, the visit may have to be switched to a telephone visit.  With either a video or telephone visit, we are not always able to ensure that we have a secure connection.    ? ?I need to obtain your verbal consent now.   Are you willing to proceed with your visit today?  ?  ?Yessenia Maillet has provided verbal consent on 09/16/2021 for a virtual visit (video or telephone). ?  ?Kathryn Simas, FNP  ? ?Date: 09/16/2021 11:33 AM ? ? ?Virtual Visit via Video Note  ? ?IViviano Henry, connected with  Marquasha Brutus  ([id number], February 10, 1986) on 09/16/21 at 11:45 AM EST by a video-enabled telemedicine application and verified that I am speaking with the correct person using two identifiers. ? ?Location: ?Patient: Virtual Visit Location Patient: Home ?Provider: Virtual Visit Location Provider: Home Office ?  ?I discussed the limitations of evaluation and management by telemedicine  and the availability of in person appointments. The patient expressed understanding and agreed to proceed.   ? ?History of Present Illness: ?Kathryn Henry is a 36 y.o. who identifies as a female who was assigned female at birth, and is being seen today with symptoms of a UTI that have been present for the past week ?She has been using AZO over the counter  ?She has also been drinking cranberry juice ? ?She has burning with urination, urinary frequency and she is generally uncomfortable  ? ?Denies fever  ?Denies back pain ?Problems:  ?Patient Active Problem List  ? Diagnosis Date Noted  ? GAD (generalized anxiety disorder) 02/27/2019  ? MDD (major depressive disorder) 02/27/2019  ? Insomnia, unspecified 02/27/2019  ? ADD (attention deficit disorder) 04/28/2018  ?  ?Allergies: No Known Allergies ?Medications:  ?Current Outpatient Medications:  ?  Adapalene 0.3 % gel, Apply 1 application topically at bedtime. , Disp: , Rfl: 1 ?  amitriptyline (ELAVIL) 50 MG tablet, , Disp: , Rfl:  ?  amoxicillin-clavulanate (AUGMENTIN) 875-125 MG tablet, Take 1 tablet by mouth 2 (two) times daily., Disp: 14 tablet, Rfl: 0 ?  amphetamine-dextroamphetamine (ADDERALL XR) 30 MG 24 hr capsule, Take 1 capsule (30 mg total) by mouth 2 (two) times daily., Disp: 60 capsule, Rfl: 0 ?  amphetamine-dextroamphetamine (ADDERALL XR) 30 MG 24 hr capsule, Take 1 capsule (30 mg total) by mouth 2 (  two) times daily., Disp: 60 capsule, Rfl: 0 ?  amphetamine-dextroamphetamine (ADDERALL XR) 30 MG 24 hr capsule, Take 1 capsule (30 mg total) by mouth 2 (two) times daily., Disp: 60 capsule, Rfl: 0 ?  desvenlafaxine (PRISTIQ) 100 MG 24 hr tablet, TAKE 1 TABLET(100 MG) BY MOUTH DAILY, Disp: 30 tablet, Rfl: 5 ?  diazepam (VALIUM) 5 MG tablet, TAKE 1 TABLET(5 MG) BY MOUTH THREE TIMES DAILY AS NEEDED, Disp: 90 tablet, Rfl: 2 ?  drospirenone-ethinyl estradiol (YAZ) 3-0.02 MG tablet, TK 1 T PO QD, Disp: , Rfl:  ?  FLUoxetine (PROZAC) 40 MG capsule, TAKE 1 CAPSULE(20  MG) BY MOUTH DAILY, Disp: 30 capsule, Rfl: 5 ?  gabapentin (NEURONTIN) 600 MG tablet, TAKE 1 TABLET(600 MG) BY MOUTH FOUR TIMES DAILY, Disp: 120 tablet, Rfl: 5 ?  JUNEL FE 1/20 1-20 MG-MCG tablet, Take 1 tablet by mouth daily., Disp: , Rfl: 0 ?  ketoconazole (NIZORAL) 2 % cream, Apply 1 application topically daily., Disp: , Rfl: 2 ?  lisdexamfetamine (VYVANSE) 40 MG capsule, Take 1 capsule (40 mg total) by mouth every morning., Disp: 30 capsule, Rfl: 0 ?  omeprazole (PRILOSEC) 20 MG capsule, TK 1 C PO BID, Disp: , Rfl:  ? ?Observations/Objective: ?Patient is well-developed, well-nourished in no acute distress.  ?Resting comfortably  at home.  ?Head is normocephalic, atraumatic.  ?No labored breathing.  ?Speech is clear and coherent with logical content.  ?Patient is alert and oriented at baseline.  ? ? ?Assessment and Plan: ?1. Acute cystitis without hematuria ? ?Meds ordered this encounter  ?Medications  ? cephALEXin (KEFLEX) 500 MG capsule  ?  Sig: Take 1 capsule (500 mg total) by mouth 2 (two) times daily for 7 days.  ?  Dispense:  14 capsule  ?  Refill:  0  ? phenazopyridine (PYRIDIUM) 95 MG tablet  ?  Sig: Take 1 tablet (95 mg total) by mouth 3 (three) times daily as needed for pain.  ?  Dispense:  10 tablet  ?  Refill:  0  ?  ? ? ?Follow Up Instructions: ?I discussed the assessment and treatment plan with the patient. The patient was provided an opportunity to ask questions and all were answered. The patient agreed with the plan and demonstrated an understanding of the instructions.  A copy of instructions were sent to the patient via MyChart unless otherwise noted below.  ? ? ?The patient was advised to call back or seek an in-person evaluation if the symptoms worsen or if the condition fails to improve as anticipated. ? ?Time:  ?I spent 15 minutes with the patient via telehealth technology discussing the above problems/concerns.   ? ?Kathryn Simas, FNP  ?

## 2021-09-16 NOTE — Progress Notes (Signed)
Duplicate. Patient did VV ?

## 2021-09-22 ENCOUNTER — Telehealth (INDEPENDENT_AMBULATORY_CARE_PROVIDER_SITE_OTHER): Admitting: Adult Health

## 2021-09-22 ENCOUNTER — Encounter: Payer: Self-pay | Admitting: Adult Health

## 2021-09-22 DIAGNOSIS — F411 Generalized anxiety disorder: Secondary | ICD-10-CM | POA: Diagnosis not present

## 2021-09-22 DIAGNOSIS — F331 Major depressive disorder, recurrent, moderate: Secondary | ICD-10-CM | POA: Diagnosis not present

## 2021-09-22 DIAGNOSIS — F902 Attention-deficit hyperactivity disorder, combined type: Secondary | ICD-10-CM

## 2021-09-22 DIAGNOSIS — G4700 Insomnia, unspecified: Secondary | ICD-10-CM

## 2021-09-22 MED ORDER — GABAPENTIN 600 MG PO TABS: ORAL_TABLET | ORAL | 5 refills | Status: DC

## 2021-09-22 MED ORDER — LISDEXAMFETAMINE DIMESYLATE 50 MG PO CAPS: 50.0000 mg | ORAL_CAPSULE | Freq: Every day | ORAL | 0 refills | Status: DC

## 2021-09-22 MED ORDER — FLUOXETINE HCL 10 MG PO CAPS: 10.0000 mg | ORAL_CAPSULE | Freq: Every day | ORAL | 5 refills | Status: DC

## 2021-09-22 NOTE — Progress Notes (Signed)
Kathryn Henry [id number] [birth year] 36 y.o.  Virtual Visit via Video Note  I connected with pt @ on 09/22/21 at  8:20 AM EDT by a video enabled telemedicine application and verified that I am speaking with the correct person using two identifiers.   I discussed the limitations of evaluation and management by telemedicine and the availability of in person appointments. The patient expressed understanding and agreed to proceed.  I discussed the assessment and treatment plan with the patient. The patient was provided an opportunity to ask questions and all were answered. The patient agreed with the plan and demonstrated an understanding of the instructions.   The patient was advised to call back or seek an in-person evaluation if the symptoms worsen or if the condition fails to improve as anticipated.  I provided 25 minutes of non-face-to-face time during this encounter.  The patient was located at home.  [facility]   Kathryn Gibbsegina N Caitlyn Buchanan, NP   Subjective:   Patient ID:  Kathryn Henry is a 36 y.o. (DOB [birth year]) female.  Chief Complaint: No chief complaint on file.   HPI Kathryn Henry presents for follow-up of GAD, MDD, OCD, insomnia, and ADHD.  Describes mood today as "ok". Pleasant. Denies tearfulness. Mood symptoms - decreased depression, anxiety, and irritability. Mood is consistent. Stating "I'm doing alright". Feels like medications are helpful. Family doing well. Stable interest and motivation. Taking medications as prescribed.  Energy levels stable. Active, has a regular exercise routine.  Enjoys some usual interests and activities. Married. Lives with husband and daughter (7th grader) and one dog. Appetite adequate. Weight loss - 158 pounds - 66". Sleeps well most nights. Averages 8 hours.  Focus and concentration stable. Completing tasks. Managing aspects of household. Stay at home mom. Denies SI or HI.  Denies AH or VH.   Seeing  Neurologist for headaches  Previous medications: Abilify, Celexa, Wellbutrin, Prozac, Latuda, Rexulti, Cogentin, Lamictal-rash   Review of Systems:  Review of Systems  Musculoskeletal:  Negative for gait problem.  Neurological:  Negative for tremors.  Psychiatric/Behavioral:         Please refer to HPI   Medications: I have reviewed the patient's current medications.  Current Outpatient Medications  Medication Sig Dispense Refill   FLUoxetine (PROZAC) 10 MG capsule Take 1 capsule (10 mg total) by mouth daily. 30 capsule 5   lisdexamfetamine (VYVANSE) 50 MG capsule Take 1 capsule (50 mg total) by mouth daily. 30 capsule 0   Adapalene 0.3 % gel Apply 1 application topically at bedtime.   1   amitriptyline (ELAVIL) 50 MG tablet      amphetamine-dextroamphetamine (ADDERALL XR) 30 MG 24 hr capsule Take 1 capsule (30 mg total) by mouth 2 (two) times daily. 60 capsule 0   amphetamine-dextroamphetamine (ADDERALL XR) 30 MG 24 hr capsule Take 1 capsule (30 mg total) by mouth 2 (two) times daily. 60 capsule 0   amphetamine-dextroamphetamine (ADDERALL XR) 30 MG 24 hr capsule Take 1 capsule (30 mg total) by mouth 2 (two) times daily. 60 capsule 0   cephALEXin (KEFLEX) 500 MG capsule Take 1 capsule (500 mg total) by mouth 2 (two) times daily for 7 days. 14 capsule 0   desvenlafaxine (PRISTIQ) 100 MG 24 hr tablet TAKE 1 TABLET(100 MG) BY MOUTH DAILY 30 tablet 5   diazepam (VALIUM) 5 MG tablet TAKE 1 TABLET(5 MG) BY MOUTH THREE TIMES DAILY AS NEEDED 90 tablet 2   drospirenone-ethinyl estradiol (YAZ) 3-0.02 MG tablet TK  1 T PO QD     FLUoxetine (PROZAC) 40 MG capsule TAKE 1 CAPSULE(20 MG) BY MOUTH DAILY 30 capsule 5   gabapentin (NEURONTIN) 600 MG tablet TAKE 1 TABLET(600 MG) BY MOUTH FOUR TIMES DAILY 120 tablet 5   JUNEL FE 1/20 1-20 MG-MCG tablet Take 1 tablet by mouth daily.  0   ketoconazole (NIZORAL) 2 % cream Apply 1 application topically daily.  2   lisdexamfetamine (VYVANSE) 40 MG capsule Take  1 capsule (40 mg total) by mouth every morning. 30 capsule 0   omeprazole (PRILOSEC) 20 MG capsule TK 1 C PO BID     phenazopyridine (PYRIDIUM) 95 MG tablet Take 1 tablet (95 mg total) by mouth 3 (three) times daily as needed for pain. 10 tablet 0   No current facility-administered medications for this visit.    Medication Side Effects: None  Allergies: No Known Allergies  No past medical history on file.  No family history on file.  Social History   Socioeconomic History   Marital status: Married    Spouse name: Not on file   Number of children: Not on file   Years of education: Not on file   Highest education level: Not on file  Occupational History   Not on file  Tobacco Use   Smoking status: Former   Smokeless tobacco: Never  Vaping Use   Vaping Use: Never used  Substance and Sexual Activity   Alcohol use: Never   Drug use: Never   Sexual activity: Not on file  Other Topics Concern   Not on file  Social History Narrative   Not on file   Social Determinants of Health   Financial Resource Strain: Not on file  Food Insecurity: Not on file  Transportation Needs: Not on file  Physical Activity: Not on file  Stress: Not on file  Social Connections: Not on file  Intimate Partner Violence: Not on file    Past Medical History, Surgical history, Social history, and Family history were reviewed and updated as appropriate.   Please see review of systems for further details on the patient's review from today.   Objective:   Physical Exam:  There were no vitals taken for this visit.  Physical Exam Constitutional:      General: She is not in acute distress. Musculoskeletal:        General: No deformity.  Neurological:     Mental Status: She is alert and oriented to person, place, and time.     Coordination: Coordination normal.  Psychiatric:        Attention and Perception: Attention and perception normal. She does not perceive auditory or visual  hallucinations.        Mood and Affect: Mood normal. Mood is not anxious or depressed. Affect is not labile, blunt, angry or inappropriate.        Speech: Speech normal.        Behavior: Behavior normal.        Thought Content: Thought content normal. Thought content is not paranoid or delusional. Thought content does not include homicidal or suicidal ideation. Thought content does not include homicidal or suicidal plan.        Cognition and Memory: Cognition and memory normal.        Judgment: Judgment normal.     Comments: Insight intact    Lab Review:     Component Value Date/Time   NA 141 04/05/2018 1341   K 3.5 04/05/2018 1341   CL  104 04/05/2018 1341   CO2 27 04/05/2018 1341   GLUCOSE 92 04/05/2018 1341   BUN 10 04/05/2018 1341   CREATININE 0.65 04/05/2018 1341   CALCIUM 8.9 04/05/2018 1341   PROT 6.5 03/22/2018 1222   ALBUMIN 3.6 03/22/2018 1222   AST 19 03/22/2018 1222   ALT 14 03/22/2018 1222   ALKPHOS 73 03/22/2018 1222   BILITOT 0.6 03/22/2018 1222   GFRNONAA >60 04/05/2018 1341   GFRAA >60 04/05/2018 1341       Component Value Date/Time   WBC 7.7 04/05/2018 1341   RBC 4.34 04/05/2018 1341   HGB 13.5 04/05/2018 1341   HCT 40.0 04/05/2018 1341   PLT 357 04/05/2018 1341   MCV 92.2 04/05/2018 1341   MCH 31.1 04/05/2018 1341   MCHC 33.8 04/05/2018 1341   RDW 13.5 04/05/2018 1341   LYMPHSABS 1.5 03/22/2018 1222   MONOABS 0.4 03/22/2018 1222   EOSABS 0.1 03/22/2018 1222   BASOSABS 0.0 03/22/2018 1222    No results found for: POCLITH, LITHIUM   No results found for: PHENYTOIN, PHENOBARB, VALPROATE, CBMZ   .res Assessment: Plan:    Plan:  Increase Vyvanse 40mg  to 50mg  every morning Valium 5mg  TID - using as needed - none needed Gabapentin 600mg  4 times daily - RLS Increase Prozac 40mg  to 50mg  daily  D/C Adderall XR 30mg  BID D/C Pristiq 100mg  daily - tapered off  Deplin 15mg  daily - will send through [facility]  RTC 3 months  Time spent with  patient was 30 minutes. Greater than 50% of face to face time with patient was spent on counseling and coordination of care.   Patient advised to contact office with any questions, adverse effects, or acute worsening in signs and symptoms.  Discussed potential benefits, risk, and side effects of benzodiazepines to include potential risk of tolerance and dependence, as well as possible drowsiness.  Advised patient not to drive if experiencing drowsiness and to take lowest possible effective dose to minimize risk of dependence and tolerance.  Discussed potential benefits, risks, and side effects of stimulants with patient to include increased heart rate, palpitations, insomnia, increased anxiety, increased irritability, or decreased appetite.  Instructed patient to contact office if experiencing any significant tolerability issues.  Diagnoses and all orders for this visit:  Major depressive disorder, recurrent episode, moderate (HCC) -     FLUoxetine (PROZAC) 10 MG capsule; Take 1 capsule (10 mg total) by mouth daily.  Generalized anxiety disorder -     gabapentin (NEURONTIN) 600 MG tablet; TAKE 1 TABLET(600 MG) BY MOUTH FOUR TIMES DAILY  Attention deficit hyperactivity disorder (ADHD), combined type -     lisdexamfetamine (VYVANSE) 50 MG capsule; Take 1 capsule (50 mg total) by mouth daily.  Insomnia, unspecified type -     gabapentin (NEURONTIN) 600 MG tablet; TAKE 1 TABLET(600 MG) BY MOUTH FOUR TIMES DAILY     Please see After Visit Summary for patient specific instructions.  No future appointments.   No orders of the defined types were placed in this encounter.     -------------------------------

## 2021-10-24 ENCOUNTER — Encounter: Admitting: Physician Assistant

## 2021-10-24 ENCOUNTER — Telehealth: Admitting: Physician Assistant

## 2021-10-24 DIAGNOSIS — B9689 Other specified bacterial agents as the cause of diseases classified elsewhere: Secondary | ICD-10-CM

## 2021-10-24 DIAGNOSIS — J019 Acute sinusitis, unspecified: Secondary | ICD-10-CM

## 2021-10-24 MED ORDER — AMOXICILLIN-POT CLAVULANATE 875-125 MG PO TABS: 1.0000 | ORAL_TABLET | Freq: Two times a day (BID) | ORAL | 0 refills | Status: DC

## 2021-10-24 NOTE — Patient Instructions (Signed)
?Dhruthi Fonda, thank you for joining Mar Daring, PA-C for today's virtual visit.  While this provider is not your primary care provider (PCP), if your PCP is located in our provider database this encounter information will be shared with them immediately following your visit. ? ?Consent: ?(Patient) Pahal Bonifield provided verbal consent for this virtual visit at the beginning of the encounter. ? ?Current Medications: ? ?Current Outpatient Medications:  ?  amoxicillin-clavulanate (AUGMENTIN) 875-125 MG tablet, Take 1 tablet by mouth 2 (two) times daily., Disp: 14 tablet, Rfl: 0 ?  Adapalene 0.3 % gel, Apply 1 application topically at bedtime. , Disp: , Rfl: 1 ?  amitriptyline (ELAVIL) 50 MG tablet, , Disp: , Rfl:  ?  amphetamine-dextroamphetamine (ADDERALL XR) 30 MG 24 hr capsule, Take 1 capsule (30 mg total) by mouth 2 (two) times daily., Disp: 60 capsule, Rfl: 0 ?  amphetamine-dextroamphetamine (ADDERALL XR) 30 MG 24 hr capsule, Take 1 capsule (30 mg total) by mouth 2 (two) times daily., Disp: 60 capsule, Rfl: 0 ?  amphetamine-dextroamphetamine (ADDERALL XR) 30 MG 24 hr capsule, Take 1 capsule (30 mg total) by mouth 2 (two) times daily., Disp: 60 capsule, Rfl: 0 ?  desvenlafaxine (PRISTIQ) 100 MG 24 hr tablet, TAKE 1 TABLET(100 MG) BY MOUTH DAILY, Disp: 30 tablet, Rfl: 5 ?  diazepam (VALIUM) 5 MG tablet, TAKE 1 TABLET(5 MG) BY MOUTH THREE TIMES DAILY AS NEEDED, Disp: 90 tablet, Rfl: 2 ?  drospirenone-ethinyl estradiol (YAZ) 3-0.02 MG tablet, TK 1 T PO QD, Disp: , Rfl:  ?  FLUoxetine (PROZAC) 10 MG capsule, Take 1 capsule (10 mg total) by mouth daily., Disp: 30 capsule, Rfl: 5 ?  FLUoxetine (PROZAC) 40 MG capsule, TAKE 1 CAPSULE(20 MG) BY MOUTH DAILY, Disp: 30 capsule, Rfl: 5 ?  gabapentin (NEURONTIN) 600 MG tablet, TAKE 1 TABLET(600 MG) BY MOUTH FOUR TIMES DAILY, Disp: 120 tablet, Rfl: 5 ?  JUNEL FE 1/20 1-20 MG-MCG tablet, Take 1 tablet by mouth daily., Disp: , Rfl: 0 ?  ketoconazole (NIZORAL) 2 %  cream, Apply 1 application topically daily., Disp: , Rfl: 2 ?  lisdexamfetamine (VYVANSE) 40 MG capsule, Take 1 capsule (40 mg total) by mouth every morning., Disp: 30 capsule, Rfl: 0 ?  lisdexamfetamine (VYVANSE) 50 MG capsule, Take 1 capsule (50 mg total) by mouth daily., Disp: 30 capsule, Rfl: 0 ?  omeprazole (PRILOSEC) 20 MG capsule, TK 1 C PO BID, Disp: , Rfl:  ?  phenazopyridine (PYRIDIUM) 95 MG tablet, Take 1 tablet (95 mg total) by mouth 3 (three) times daily as needed for pain., Disp: 10 tablet, Rfl: 0  ? ?Medications ordered in this encounter:  ?Meds ordered this encounter  ?Medications  ? amoxicillin-clavulanate (AUGMENTIN) 875-125 MG tablet  ?  Sig: Take 1 tablet by mouth 2 (two) times daily.  ?  Dispense:  14 tablet  ?  Refill:  0  ?  Order Specific Question:   Supervising Provider  ?  Answer:   Noemi Chapel [[id number]]  ?  ? ?*If you need refills on other medications prior to your next appointment, please contact your pharmacy* ? ?Follow-Up: ?Call back or seek an in-person evaluation if the symptoms worsen or if the condition fails to improve as anticipated. ? ?Other Instructions ? ?Sinus Infection, Adult ?A sinus infection is soreness and swelling (inflammation) of your sinuses. Sinuses are hollow spaces in the bones around your face. They are located: ?Around your eyes. ?In the middle of your forehead. ?Behind your nose. ?In your  cheekbones. ?Your sinuses and nasal passages are lined with a fluid called mucus. Mucus drains out of your sinuses. Swelling can trap mucus in your sinuses. This lets germs (bacteria, virus, or fungus) grow, which leads to infection. Most of the time, this condition is caused by a virus. ?What are the causes? ?Allergies. ?Asthma. ?Germs. ?Things that block your nose or sinuses. ?Growths in the nose (nasal polyps). ?Chemicals or irritants in the air. ?A fungus. This is rare. ?What increases the risk? ?Having a weak body defense system (immune system). ?Doing a lot of swimming or  diving. ?Using nasal sprays too much. ?Smoking. ?What are the signs or symptoms? ?The main symptoms of this condition are pain and a feeling of pressure around the sinuses. Other symptoms include: ?Stuffy nose (congestion). This may make it hard to breathe through your nose. ?Runny nose (drainage). ?Soreness, swelling, and warmth in the sinuses. ?A cough that may get worse at night. ?Being unable to smell and taste. ?Mucus that collects in the throat or the back of the nose (postnasal drip). This may cause a sore throat or bad breath. ?Being very tired (fatigued). ?A fever. ?How is this diagnosed? ?Your symptoms. ?Your medical history. ?A physical exam. ?Tests to find out if your condition is short-term (acute) or long-term (chronic). Your doctor may: ?Check your nose for growths (polyps). ?Check your sinuses using a tool that has a light on one end (endoscope). ?Check for allergies or germs. ?Do imaging tests, such as an MRI or CT scan. ?How is this treated? ?Treatment for this condition depends on the cause and whether it is short-term or long-term. ?If caused by a virus, your symptoms should go away on their own within 10 days. You may be given medicines to relieve symptoms. They include: ?Medicines that shrink swollen tissue in the nose. ?A spray that treats swelling of the nostrils. ?Rinses that help get rid of thick mucus in your nose (nasal saline washes). ?Medicines that treat allergies (antihistamines). ?Over-the-counter pain relievers. ?If caused by bacteria, your doctor may wait to see if you will get better without treatment. You may be given antibiotic medicine if you have: ?A very bad infection. ?A weak body defense system. ?If caused by growths in the nose, surgery may be needed. ?Follow these instructions at home: ?Medicines ?Take, use, or apply over-the-counter and prescription medicines only as told by your doctor. These may include nasal sprays. ?If you were prescribed an antibiotic medicine, take  it as told by your doctor. Do not stop taking it even if you start to feel better. ?Hydrate and humidify ? ?Drink enough water to keep your pee (urine) pale yellow. ?Use a cool mist humidifier to keep the humidity level in your home above 50%. ?Breathe in steam for 10-15 minutes, 3-4 times a day, or as told by your doctor. You can do this in the bathroom while a hot shower is running. ?Try not to spend time in cool or dry air. ?Rest ?Rest as much as you can. ?Sleep with your head raised (elevated). ?Make sure you get enough sleep each night. ?General instructions ? ?Put a warm, moist washcloth on your face 3-4 times a day, or as often as told by your doctor. ?Use nasal saline washes as often as told by your doctor. ?Wash your hands often with soap and water. If you cannot use soap and water, use hand sanitizer. ?Do not smoke. Avoid being around people who are smoking (secondhand smoke). ?Keep all follow-up visits. ?Contact a  doctor if: ?You have a fever. ?Your symptoms get worse. ?Your symptoms do not get better within 10 days. ?Get help right away if: ?You have a very bad headache. ?You cannot stop vomiting. ?You have very bad pain or swelling around your face or eyes. ?You have trouble seeing. ?You feel confused. ?Your neck is stiff. ?You have trouble breathing. ?These symptoms may be an emergency. Get help right away. Call 911. ?Do not wait to see if the symptoms will go away. ?Do not drive yourself to the hospital. ?Summary ?A sinus infection is swelling of your sinuses. Sinuses are hollow spaces in the bones around your face. ?This condition is caused by tissues in your nose that become inflamed or swollen. This traps germs. These can lead to infection. ?If you were prescribed an antibiotic medicine, take it as told by your doctor. Do not stop taking it even if you start to feel better. ?Keep all follow-up visits. ?This information is not intended to replace advice given to you by your health care provider.  Make sure you discuss any questions you have with your health care provider. ?Document Revised: 06/03/2021 Document Reviewed: 06/03/2021 ?Elsevier Patient Education ? [employer]. ? ? ? ?If you have

## 2021-10-24 NOTE — Progress Notes (Signed)
Patient on Video call, suspect scheduled EV in error.  ?

## 2021-10-24 NOTE — Progress Notes (Signed)
?Virtual Visit Consent  ? ?[name], you are scheduled for a virtual visit with a [facility] provider today.   ?  ?Just as with appointments in the office, your consent must be obtained to participate.  Your consent will be active for this visit and any virtual visit you may have with one of our providers in the next 365 days.   ?  ?If you have a MyChart account, a copy of this consent can be sent to you electronically.  All virtual visits are billed to your insurance company just like a traditional visit in the office.   ? ?As this is a virtual visit, video technology does not allow for your provider to perform a traditional examination.  This may limit your provider's ability to fully assess your condition.  If your provider identifies any concerns that need to be evaluated in person or the need to arrange testing (such as labs, EKG, etc.), we will make arrangements to do so.   ?  ?Although advances in technology are sophisticated, we cannot ensure that it will always work on either your end or our end.  If the connection with a video visit is poor, the visit may have to be switched to a telephone visit.  With either a video or telephone visit, we are not always able to ensure that we have a secure connection.    ? ?I need to obtain your verbal consent now.   Are you willing to proceed with your visit today?  ?  ?Kathryn Henry has provided verbal consent on 10/24/2021 for a virtual visit (video or telephone). ?  ?Margaretann Loveless, PA-C  ? ?Date: 10/24/2021 11:53 AM ? ? ?Virtual Visit via Video Note  ? ?IMargaretann Loveless, connected with  Kathryn Henry  ([id number], 07-20-1985) on 10/24/21 at 12:15 PM EDT by a video-enabled telemedicine application and verified that I am speaking with the correct person using two identifiers. ? ?Location: ?Patient: Virtual Visit Location Patient: Home ?Provider: Virtual Visit Location Provider: Home Office ?  ?I discussed the limitations of evaluation and management  by telemedicine and the availability of in person appointments. The patient expressed understanding and agreed to proceed.   ? ?History of Present Illness: ?Kathryn Henry is a 36 y.o. who identifies as a female who was assigned female at birth, and is being seen today for possible sinus infection. ? ?HPI: Sinusitis ?This is a new problem. The current episode started 1 to 4 weeks ago (8 days). The problem has been gradually worsening since onset. There has been no fever. Associated symptoms include congestion, coughing, ear pain, headaches, a hoarse voice and sinus pressure. Pertinent negatives include no chills or sore throat. Past treatments include oral decongestants (zyrtec D, vicks, humidifier). The treatment provided no relief.   ? ? ?Problems:  ?Patient Active Problem List  ? Diagnosis Date Noted  ? GAD (generalized anxiety disorder) 02/27/2019  ? MDD (major depressive disorder) 02/27/2019  ? Insomnia, unspecified 02/27/2019  ? ADD (attention deficit disorder) 04/28/2018  ?  ?Allergies: No Known Allergies ?Medications:  ?Current Outpatient Medications:  ?  amoxicillin-clavulanate (AUGMENTIN) 875-125 MG tablet, Take 1 tablet by mouth 2 (two) times daily., Disp: 14 tablet, Rfl: 0 ?  Adapalene 0.3 % gel, Apply 1 application topically at bedtime. , Disp: , Rfl: 1 ?  amitriptyline (ELAVIL) 50 MG tablet, , Disp: , Rfl:  ?  amphetamine-dextroamphetamine (ADDERALL XR) 30 MG 24 hr capsule, Take 1 capsule (30 mg total) by mouth  2 (two) times daily., Disp: 60 capsule, Rfl: 0 ?  amphetamine-dextroamphetamine (ADDERALL XR) 30 MG 24 hr capsule, Take 1 capsule (30 mg total) by mouth 2 (two) times daily., Disp: 60 capsule, Rfl: 0 ?  amphetamine-dextroamphetamine (ADDERALL XR) 30 MG 24 hr capsule, Take 1 capsule (30 mg total) by mouth 2 (two) times daily., Disp: 60 capsule, Rfl: 0 ?  desvenlafaxine (PRISTIQ) 100 MG 24 hr tablet, TAKE 1 TABLET(100 MG) BY MOUTH DAILY, Disp: 30 tablet, Rfl: 5 ?  diazepam (VALIUM) 5 MG tablet,  TAKE 1 TABLET(5 MG) BY MOUTH THREE TIMES DAILY AS NEEDED, Disp: 90 tablet, Rfl: 2 ?  drospirenone-ethinyl estradiol (YAZ) 3-0.02 MG tablet, TK 1 T PO QD, Disp: , Rfl:  ?  FLUoxetine (PROZAC) 10 MG capsule, Take 1 capsule (10 mg total) by mouth daily., Disp: 30 capsule, Rfl: 5 ?  FLUoxetine (PROZAC) 40 MG capsule, TAKE 1 CAPSULE(20 MG) BY MOUTH DAILY, Disp: 30 capsule, Rfl: 5 ?  gabapentin (NEURONTIN) 600 MG tablet, TAKE 1 TABLET(600 MG) BY MOUTH FOUR TIMES DAILY, Disp: 120 tablet, Rfl: 5 ?  JUNEL FE 1/20 1-20 MG-MCG tablet, Take 1 tablet by mouth daily., Disp: , Rfl: 0 ?  ketoconazole (NIZORAL) 2 % cream, Apply 1 application topically daily., Disp: , Rfl: 2 ?  lisdexamfetamine (VYVANSE) 40 MG capsule, Take 1 capsule (40 mg total) by mouth every morning., Disp: 30 capsule, Rfl: 0 ?  lisdexamfetamine (VYVANSE) 50 MG capsule, Take 1 capsule (50 mg total) by mouth daily., Disp: 30 capsule, Rfl: 0 ?  omeprazole (PRILOSEC) 20 MG capsule, TK 1 C PO BID, Disp: , Rfl:  ?  phenazopyridine (PYRIDIUM) 95 MG tablet, Take 1 tablet (95 mg total) by mouth 3 (three) times daily as needed for pain., Disp: 10 tablet, Rfl: 0 ? ?Observations/Objective: ?Patient is well-developed, well-nourished in no acute distress.  ?Resting comfortably at home.  ?Head is normocephalic, atraumatic.  ?No labored breathing.  ?Speech is clear and coherent with logical content.  ?Patient is alert and oriented at baseline.  ? ? ?Assessment and Plan: ?1. Acute bacterial sinusitis ?- amoxicillin-clavulanate (AUGMENTIN) 875-125 MG tablet; Take 1 tablet by mouth 2 (two) times daily.  Dispense: 14 tablet; Refill: 0 ? ?- Worsening symptoms that have not responded to OTC medications.  ?- Will give augmentin  ?- Continue allergy medications.  ?- Steam and humidifier can help ?- Stay well hydrated and get plenty of rest.  ?- Seek in person evaluation if no symptom improvement or if symptoms worsen. ? ? ?Follow Up Instructions: ?I discussed the assessment and  treatment plan with the patient. The patient was provided an opportunity to ask questions and all were answered. The patient agreed with the plan and demonstrated an understanding of the instructions.  A copy of instructions were sent to the patient via MyChart unless otherwise noted below.  ? ? ?The patient was advised to call back or seek an in-person evaluation if the symptoms worsen or if the condition fails to improve as anticipated. ? ?Time:  ?I spent 10 minutes with the patient via telehealth technology discussing the above problems/concerns.   ? ?Margaretann Loveless, PA-C ?

## 2021-10-28 ENCOUNTER — Other Ambulatory Visit: Payer: Self-pay | Admitting: Adult Health

## 2021-10-28 ENCOUNTER — Telehealth: Payer: Self-pay | Admitting: Adult Health

## 2021-10-28 DIAGNOSIS — F902 Attention-deficit hyperactivity disorder, combined type: Secondary | ICD-10-CM

## 2021-10-28 MED ORDER — LISDEXAMFETAMINE DIMESYLATE 50 MG PO CAPS: 50.0000 mg | ORAL_CAPSULE | Freq: Every day | ORAL | 0 refills | Status: DC

## 2021-10-28 NOTE — Telephone Encounter (Signed)
Pt called in to request RF on her Vyvanse 50mg  to go to: ?[place]  ?

## 2021-10-28 NOTE — Telephone Encounter (Signed)
Script sent  

## 2021-11-01 ENCOUNTER — Telehealth: Admitting: Nurse Practitioner

## 2021-11-01 DIAGNOSIS — J0111 Acute recurrent frontal sinusitis: Secondary | ICD-10-CM

## 2021-11-01 MED ORDER — PREDNISONE 10 MG PO TABS: 10.0000 mg | ORAL_TABLET | Freq: Every day | ORAL | 0 refills | Status: AC

## 2021-11-01 NOTE — Progress Notes (Signed)
?Virtual Visit Consent  ? ?[name], you are scheduled for a virtual visit with a [facility] provider today.   ?  ?Just as with appointments in the office, your consent must be obtained to participate.  Your consent will be active for this visit and any virtual visit you may have with one of our providers in the next 365 days.   ?  ?If you have a MyChart account, a copy of this consent can be sent to you electronically.  All virtual visits are billed to your insurance company just like a traditional visit in the office.   ? ?As this is a virtual visit, video technology does not allow for your provider to perform a traditional examination.  This may limit your provider's ability to fully assess your condition.  If your provider identifies any concerns that need to be evaluated in person or the need to arrange testing (such as labs, EKG, etc.), we will make arrangements to do so.   ?  ?Although advances in technology are sophisticated, we cannot ensure that it will always work on either your end or our end.  If the connection with a video visit is poor, the visit may have to be switched to a telephone visit.  With either a video or telephone visit, we are not always able to ensure that we have a secure connection.    ? ?Also, by engaging in this virtual visit, you consent to the provision of healthcare. Additionally, you authorize for your insurance to be billed (if applicable) for the services provided during this visit.  ? ?I need to obtain your verbal consent now.   Are you willing to proceed with your visit today?  ?  ?Kathryn Henry has provided verbal consent on 11/01/2021 for a virtual visit (video or telephone). ?  ?Claiborne Rigg, NP  ? ?Date: 11/01/2021 12:58 PM ? ? ?Virtual Visit via Video Note  ? ?IClaiborne Rigg, connected with  Kathryn Henry  ([id number], [birth year]) on 11/01/21 at 12:30 PM EDT by a video-enabled telemedicine application and verified that I am speaking with the correct person  using two identifiers. ? ?Location: ?Patient: Virtual Visit Location Patient: Home ?Provider: Virtual Visit Location Provider: Home Office ?  ?I discussed the limitations of evaluation and management by telemedicine and the availability of in person appointments. The patient expressed understanding and agreed to proceed.   ? ?History of Present Illness: ?Kathryn Henry is a 36 y.o. who identifies as a female who was assigned female at birth, and is being seen today for recurrent sinusitis. ? ?Sinus Pain: Patient complains of achiness, congestion, facial pain, headache described as pressure, nasal congestion, no  fever, and sinus pressure.  Onset of symptoms was several weeks ago, unchanged since that time.  Past history is significant for  recurrent sinusisitis . Patient is non-smoker ?She recently finished a 7 day course of augmentin 2 days ago for sinusitis and reports symptoms have not improved. She has a prescription of doxycyline 100 mg that was given to her by the dermatologist. She reports it caused stomach upset so so only took them for a few days last year. She does not have a PCP and reports re occurring sinusitis symptoms.  ? ?  ?Problems:  ?Patient Active Problem List  ? Diagnosis Date Noted  ? GAD (generalized anxiety disorder) 02/27/2019  ? MDD (major depressive disorder) 02/27/2019  ? Insomnia, unspecified 02/27/2019  ? ADD (attention deficit disorder) 04/28/2018  ?  ?Allergies:  No Known Allergies ?Medications:  ?Current Outpatient Medications:  ?  predniSONE (DELTASONE) 10 MG tablet, Take 1 tablet (10 mg total) by mouth daily with breakfast for 7 days., Disp: 7 tablet, Rfl: 0 ?  Adapalene 0.3 % gel, Apply 1 application topically at bedtime. , Disp: , Rfl: 1 ?  amitriptyline (ELAVIL) 50 MG tablet, , Disp: , Rfl:  ?  amoxicillin-clavulanate (AUGMENTIN) 875-125 MG tablet, Take 1 tablet by mouth 2 (two) times daily., Disp: 14 tablet, Rfl: 0 ?  amphetamine-dextroamphetamine (ADDERALL XR) 30 MG 24 hr  capsule, Take 1 capsule (30 mg total) by mouth 2 (two) times daily., Disp: 60 capsule, Rfl: 0 ?  amphetamine-dextroamphetamine (ADDERALL XR) 30 MG 24 hr capsule, Take 1 capsule (30 mg total) by mouth 2 (two) times daily., Disp: 60 capsule, Rfl: 0 ?  amphetamine-dextroamphetamine (ADDERALL XR) 30 MG 24 hr capsule, Take 1 capsule (30 mg total) by mouth 2 (two) times daily., Disp: 60 capsule, Rfl: 0 ?  desvenlafaxine (PRISTIQ) 100 MG 24 hr tablet, TAKE 1 TABLET(100 MG) BY MOUTH DAILY, Disp: 30 tablet, Rfl: 5 ?  diazepam (VALIUM) 5 MG tablet, TAKE 1 TABLET(5 MG) BY MOUTH THREE TIMES DAILY AS NEEDED, Disp: 90 tablet, Rfl: 2 ?  drospirenone-ethinyl estradiol (YAZ) 3-0.02 MG tablet, TK 1 T PO QD, Disp: , Rfl:  ?  FLUoxetine (PROZAC) 10 MG capsule, Take 1 capsule (10 mg total) by mouth daily., Disp: 30 capsule, Rfl: 5 ?  FLUoxetine (PROZAC) 40 MG capsule, TAKE 1 CAPSULE(20 MG) BY MOUTH DAILY, Disp: 30 capsule, Rfl: 5 ?  gabapentin (NEURONTIN) 600 MG tablet, TAKE 1 TABLET(600 MG) BY MOUTH FOUR TIMES DAILY, Disp: 120 tablet, Rfl: 5 ?  JUNEL FE 1/20 1-20 MG-MCG tablet, Take 1 tablet by mouth daily., Disp: , Rfl: 0 ?  ketoconazole (NIZORAL) 2 % cream, Apply 1 application topically daily., Disp: , Rfl: 2 ?  lisdexamfetamine (VYVANSE) 40 MG capsule, Take 1 capsule (40 mg total) by mouth every morning., Disp: 30 capsule, Rfl: 0 ?  lisdexamfetamine (VYVANSE) 50 MG capsule, Take 1 capsule (50 mg total) by mouth daily., Disp: 30 capsule, Rfl: 0 ?  omeprazole (PRILOSEC) 20 MG capsule, TK 1 C PO BID, Disp: , Rfl:  ?  phenazopyridine (PYRIDIUM) 95 MG tablet, Take 1 tablet (95 mg total) by mouth 3 (three) times daily as needed for pain., Disp: 10 tablet, Rfl: 0 ? ?Observations/Objective: ?Patient is well-developed, well-nourished in no acute distress.  ?Resting comfortably at home.  ?Head is normocephalic, atraumatic.  ?No labored breathing. ?Speech is clear and coherent with logical content.  ?Patient is alert and oriented at baseline.   ? ? ?Assessment and Plan: ?1. Acute recurrent frontal sinusitis ?- predniSONE (DELTASONE) 10 MG tablet; Take 1 tablet (10 mg total) by mouth daily with breakfast for 7 days.  Dispense: 7 tablet; Refill: 0 ?Patient will take the doxycycline as she already has 100 mg twice a day for 7 days.  She was also instructed to obtain a PCP for referral to ENT as she has a history of recurrent sinusitis.  She verbalized understanding. ? ?Follow Up Instructions: ?I discussed the assessment and treatment plan with the patient. The patient was provided an opportunity to ask questions and all were answered. The patient agreed with the plan and demonstrated an understanding of the instructions.  A copy of instructions were sent to the patient via MyChart unless otherwise noted below.  ? ?The patient was advised to call back or seek an in-person evaluation  if the symptoms worsen or if the condition fails to improve as anticipated. ? ?Time:  ?I spent 12 minutes with the patient via telehealth technology discussing the above problems/concerns.   ? ?Claiborne Rigg, NP  ?

## 2021-11-01 NOTE — Patient Instructions (Addendum)
?Kathryn Henry, thank you for joining Gildardo Pounds, NP for today's virtual visit.  While this provider is not your primary care provider (PCP), if your PCP is located in our provider database this encounter information will be shared with them immediately following your visit. ? ?Consent: ?(Patient) Kathryn Henry provided verbal consent for this virtual visit at the beginning of the encounter. ? ?Current Medications: ? ?Current Outpatient Medications:  ?  predniSONE (DELTASONE) 10 MG tablet, Take 1 tablet (10 mg total) by mouth daily with breakfast for 7 days., Disp: 7 tablet, Rfl: 0 ?  Adapalene 0.3 % gel, Apply 1 application topically at bedtime. , Disp: , Rfl: 1 ?  amitriptyline (ELAVIL) 50 MG tablet, , Disp: , Rfl:  ?  amoxicillin-clavulanate (AUGMENTIN) 875-125 MG tablet, Take 1 tablet by mouth 2 (two) times daily., Disp: 14 tablet, Rfl: 0 ?  amphetamine-dextroamphetamine (ADDERALL XR) 30 MG 24 hr capsule, Take 1 capsule (30 mg total) by mouth 2 (two) times daily., Disp: 60 capsule, Rfl: 0 ?  amphetamine-dextroamphetamine (ADDERALL XR) 30 MG 24 hr capsule, Take 1 capsule (30 mg total) by mouth 2 (two) times daily., Disp: 60 capsule, Rfl: 0 ?  amphetamine-dextroamphetamine (ADDERALL XR) 30 MG 24 hr capsule, Take 1 capsule (30 mg total) by mouth 2 (two) times daily., Disp: 60 capsule, Rfl: 0 ?  desvenlafaxine (PRISTIQ) 100 MG 24 hr tablet, TAKE 1 TABLET(100 MG) BY MOUTH DAILY, Disp: 30 tablet, Rfl: 5 ?  diazepam (VALIUM) 5 MG tablet, TAKE 1 TABLET(5 MG) BY MOUTH THREE TIMES DAILY AS NEEDED, Disp: 90 tablet, Rfl: 2 ?  drospirenone-ethinyl estradiol (YAZ) 3-0.02 MG tablet, TK 1 T PO QD, Disp: , Rfl:  ?  FLUoxetine (PROZAC) 10 MG capsule, Take 1 capsule (10 mg total) by mouth daily., Disp: 30 capsule, Rfl: 5 ?  FLUoxetine (PROZAC) 40 MG capsule, TAKE 1 CAPSULE(20 MG) BY MOUTH DAILY, Disp: 30 capsule, Rfl: 5 ?  gabapentin (NEURONTIN) 600 MG tablet, TAKE 1 TABLET(600 MG) BY MOUTH FOUR TIMES DAILY, Disp: 120  tablet, Rfl: 5 ?  JUNEL FE 1/20 1-20 MG-MCG tablet, Take 1 tablet by mouth daily., Disp: , Rfl: 0 ?  ketoconazole (NIZORAL) 2 % cream, Apply 1 application topically daily., Disp: , Rfl: 2 ?  lisdexamfetamine (VYVANSE) 40 MG capsule, Take 1 capsule (40 mg total) by mouth every morning., Disp: 30 capsule, Rfl: 0 ?  lisdexamfetamine (VYVANSE) 50 MG capsule, Take 1 capsule (50 mg total) by mouth daily., Disp: 30 capsule, Rfl: 0 ?  omeprazole (PRILOSEC) 20 MG capsule, TK 1 C PO BID, Disp: , Rfl:  ?  phenazopyridine (PYRIDIUM) 95 MG tablet, Take 1 tablet (95 mg total) by mouth 3 (three) times daily as needed for pain., Disp: 10 tablet, Rfl: 0  ? ?Medications ordered in this encounter:  ?Meds ordered this encounter  ?Medications  ? predniSONE (DELTASONE) 10 MG tablet  ?  Sig: Take 1 tablet (10 mg total) by mouth daily with breakfast for 7 days.  ?  Dispense:  7 tablet  ?  Refill:  0  ?  Order Specific Question:   Supervising Provider  ?  Answer:   Noemi Chapel [[id number]]  ?  ? ?*If you need refills on other medications prior to your next appointment, please contact your pharmacy* ? ?Follow-Up: ?Call back or seek an in-person evaluation if the symptoms worsen or if the condition fails to improve as anticipated. ? ?Other Instructions ?Patient will take the doxycycline as she already has 100 mg  twice a day for 7 days (expiration date on bottle confirmed as June 2023).  She was also instructed to obtain a PCP for referral to ENT as she has a history of recurrent sinusitis.  She verbalized understanding ? ? ?If you have been instructed to have an in-person evaluation today at a local Urgent Care facility, please use the link below. It will take you to a list of all of our available [facility], including address, phone number and hours of operation. Please do not delay care.  ?[facility] ? ?If you or a family member do not have a primary care provider, use the link below to schedule a visit and establish  care. When you choose a [facility] primary care physician or advanced practice provider, you gain a long-term partner in health. ?Find a Primary Care Provider ? ?Learn more about [facility] in-office and virtual care options: ?[facility]  ?

## 2021-11-27 ENCOUNTER — Other Ambulatory Visit: Payer: Self-pay | Admitting: Adult Health

## 2021-11-27 ENCOUNTER — Telehealth: Payer: Self-pay | Admitting: Adult Health

## 2021-11-27 DIAGNOSIS — F902 Attention-deficit hyperactivity disorder, combined type: Secondary | ICD-10-CM

## 2021-11-27 MED ORDER — LISDEXAMFETAMINE DIMESYLATE 50 MG PO CAPS: 50.0000 mg | ORAL_CAPSULE | Freq: Every day | ORAL | 0 refills | Status: DC

## 2021-11-27 NOTE — Progress Notes (Signed)
Prescription sent

## 2021-11-27 NOTE — Telephone Encounter (Signed)
Pt requested refill of Vyvanse to [facility] [address], [place], [place] [address].   No upcoming appt scheduled.

## 2021-11-27 NOTE — Telephone Encounter (Signed)
Script sent  

## 2021-12-29 ENCOUNTER — Other Ambulatory Visit: Payer: Self-pay

## 2021-12-29 ENCOUNTER — Telehealth: Payer: Self-pay | Admitting: Adult Health

## 2021-12-29 DIAGNOSIS — F902 Attention-deficit hyperactivity disorder, combined type: Secondary | ICD-10-CM

## 2021-12-29 MED ORDER — LISDEXAMFETAMINE DIMESYLATE 50 MG PO CAPS: 50.0000 mg | ORAL_CAPSULE | Freq: Every day | ORAL | 0 refills | Status: DC

## 2021-12-29 NOTE — Telephone Encounter (Signed)
Pt has an appt on 6/28. She needs a refill on her vyvanse 50 mg on [facility] on [address] in [place]

## 2021-12-29 NOTE — Telephone Encounter (Signed)
Pended.

## 2022-01-05 ENCOUNTER — Other Ambulatory Visit: Payer: Self-pay | Admitting: Adult Health

## 2022-01-05 DIAGNOSIS — F411 Generalized anxiety disorder: Secondary | ICD-10-CM

## 2022-01-05 DIAGNOSIS — F331 Major depressive disorder, recurrent, moderate: Secondary | ICD-10-CM

## 2022-01-07 ENCOUNTER — Encounter: Payer: Self-pay | Admitting: Adult Health

## 2022-01-07 ENCOUNTER — Telehealth (INDEPENDENT_AMBULATORY_CARE_PROVIDER_SITE_OTHER): Admitting: Adult Health

## 2022-01-07 DIAGNOSIS — F902 Attention-deficit hyperactivity disorder, combined type: Secondary | ICD-10-CM

## 2022-01-07 DIAGNOSIS — F411 Generalized anxiety disorder: Secondary | ICD-10-CM

## 2022-01-07 DIAGNOSIS — G4700 Insomnia, unspecified: Secondary | ICD-10-CM | POA: Diagnosis not present

## 2022-01-07 DIAGNOSIS — F331 Major depressive disorder, recurrent, moderate: Secondary | ICD-10-CM | POA: Diagnosis not present

## 2022-01-07 MED ORDER — LISDEXAMFETAMINE DIMESYLATE 50 MG PO CAPS: 50.0000 mg | ORAL_CAPSULE | ORAL | 0 refills | Status: DC

## 2022-01-07 MED ORDER — FLUOXETINE HCL 40 MG PO CAPS: ORAL_CAPSULE | ORAL | 5 refills | Status: DC

## 2022-01-07 MED ORDER — DIAZEPAM 5 MG PO TABS: ORAL_TABLET | ORAL | 2 refills | Status: AC

## 2022-01-07 MED ORDER — LISDEXAMFETAMINE DIMESYLATE 50 MG PO CAPS: 50.0000 mg | ORAL_CAPSULE | Freq: Every day | ORAL | 0 refills | Status: DC

## 2022-01-07 MED ORDER — FLUOXETINE HCL 10 MG PO CAPS: 10.0000 mg | ORAL_CAPSULE | Freq: Every day | ORAL | 5 refills | Status: DC

## 2022-01-07 MED ORDER — GABAPENTIN 600 MG PO TABS
ORAL_TABLET | ORAL | 5 refills | Status: DC
Start: 2022-01-07 — End: 2022-05-21

## 2022-01-07 NOTE — Progress Notes (Signed)
Kathryn Henry [id number] [birth year] 36 y.o.  Virtual Visit via Video Note  I connected with pt @ on 01/07/22 at  8:00 AM EDT by a video enabled telemedicine application and verified that I am speaking with the correct person using two identifiers.   I discussed the limitations of evaluation and management by telemedicine and the availability of in person appointments. The patient expressed understanding and agreed to proceed.  I discussed the assessment and treatment plan with the patient. The patient was provided an opportunity to ask questions and all were answered. The patient agreed with the plan and demonstrated an understanding of the instructions.   The patient was advised to call back or seek an in-person evaluation if the symptoms worsen or if the condition fails to improve as anticipated.  I provided 25 minutes of non-face-to-face time during this encounter.  The patient was located at home.  The provider was located at [facility].   Kathryn Gibbs, NP   Subjective:   Patient ID:  Kathryn Henry is a 36 y.o. (DOB July 31, 1985) female.  Chief Complaint: No chief complaint on file.   HPI Kathryn Henry presents for follow-up of GAD, MDD, OCD, insomnia, and ADHD.  Describes mood today as "ok". Pleasant. Denies tearfulness. Mood symptoms - denies depression, anxiety, and irritability. Mood is consistent. Stating "I'm doing good". Feels like medications are working well. Family doing well. Stable interest and motivation. Taking medications as prescribed.  Energy levels stable. Active, has a regular exercise routine.  Enjoys some usual interests and activities. Married. Lives with husband, daughter and dog. Appetite adequate. Weight loss - 153 from 158 pounds - 66". Sleeps well most nights. Averages 7 to 9 hours.  Focus and concentration stable. Completing tasks. Managing aspects of household. Stay at home mom. Denies SI or HI.  Denies AH or VH.   Seeing  Neurologist for headaches  Previous medications: Abilify, Celexa, Wellbutrin, Prozac, Latuda, Rexulti, Cogentin, Lamictal-rash   Review of Systems:  Review of Systems  Musculoskeletal:  Negative for gait problem.  Neurological:  Negative for tremors.  Psychiatric/Behavioral:         Please refer to HPI    Medications: I have reviewed the patient's current medications.  Current Outpatient Medications  Medication Sig Dispense Refill   Adapalene 0.3 % gel Apply 1 application topically at bedtime.   1   amitriptyline (ELAVIL) 50 MG tablet      amoxicillin-clavulanate (AUGMENTIN) 875-125 MG tablet Take 1 tablet by mouth 2 (two) times daily. 14 tablet 0   desvenlafaxine (PRISTIQ) 100 MG 24 hr tablet TAKE 1 TABLET(100 MG) BY MOUTH DAILY 30 tablet 5   diazepam (VALIUM) 5 MG tablet TAKE 1 TABLET(5 MG) BY MOUTH THREE TIMES DAILY AS NEEDED 90 tablet 2   drospirenone-ethinyl estradiol (YAZ) 3-0.02 MG tablet TK 1 T PO QD     FLUoxetine (PROZAC) 10 MG capsule Take 1 capsule (10 mg total) by mouth daily. 30 capsule 5   FLUoxetine (PROZAC) 40 MG capsule TAKE ONE CAPSULE BY MOUTH DAILY 30 capsule 0   gabapentin (NEURONTIN) 600 MG tablet TAKE 1 TABLET(600 MG) BY MOUTH FOUR TIMES DAILY 120 tablet 5   JUNEL FE 1/20 1-20 MG-MCG tablet Take 1 tablet by mouth daily.  0   ketoconazole (NIZORAL) 2 % cream Apply 1 application topically daily.  2   lisdexamfetamine (VYVANSE) 40 MG capsule Take 1 capsule (40 mg total) by mouth every morning. 30 capsule 0   lisdexamfetamine (VYVANSE) 50 MG capsule  Take 1 capsule (50 mg total) by mouth daily. 30 capsule 0   omeprazole (PRILOSEC) 20 MG capsule TK 1 C PO BID     phenazopyridine (PYRIDIUM) 95 MG tablet Take 1 tablet (95 mg total) by mouth 3 (three) times daily as needed for pain. 10 tablet 0   No current facility-administered medications for this visit.    Medication Side Effects: None  Allergies: No Known Allergies  No past medical history on file.  No  family history on file.  Social History   Socioeconomic History   Marital status: Married    Spouse name: Not on file   Number of children: Not on file   Years of education: Not on file   Highest education level: Not on file  Occupational History   Not on file  Tobacco Use   Smoking status: Former   Smokeless tobacco: Never  Vaping Use   Vaping Use: Never used  Substance and Sexual Activity   Alcohol use: Never   Drug use: Never   Sexual activity: Not on file  Other Topics Concern   Not on file  Social History Narrative   Not on file   Social Determinants of Health   Financial Resource Strain: Not on file  Food Insecurity: Not on file  Transportation Needs: Not on file  Physical Activity: Not on file  Stress: Not on file  Social Connections: Not on file  Intimate Partner Violence: Not on file    Past Medical History, Surgical history, Social history, and Family history were reviewed and updated as appropriate.   Please see review of systems for further details on the patient's review from today.   Objective:   Physical Exam:  There were no vitals taken for this visit.  Physical Exam Constitutional:      General: She is not in acute distress. Musculoskeletal:        General: No deformity.  Neurological:     Mental Status: She is alert and oriented to person, place, and time.     Coordination: Coordination normal.  Psychiatric:        Attention and Perception: Attention and perception normal. She does not perceive auditory or visual hallucinations.        Mood and Affect: Mood normal. Mood is not anxious or depressed. Affect is not labile, blunt, angry or inappropriate.        Speech: Speech normal.        Behavior: Behavior normal.        Thought Content: Thought content normal. Thought content is not paranoid or delusional. Thought content does not include homicidal or suicidal ideation. Thought content does not include homicidal or suicidal plan.         Cognition and Memory: Cognition and memory normal.        Judgment: Judgment normal.     Comments: Insight intact     Lab Review:     Component Value Date/Time   NA 141 04/05/2018 1341   K 3.5 04/05/2018 1341   CL 104 04/05/2018 1341   CO2 27 04/05/2018 1341   GLUCOSE 92 04/05/2018 1341   BUN 10 04/05/2018 1341   CREATININE 0.65 04/05/2018 1341   CALCIUM 8.9 04/05/2018 1341   PROT 6.5 03/22/2018 1222   ALBUMIN 3.6 03/22/2018 1222   AST 19 03/22/2018 1222   ALT 14 03/22/2018 1222   ALKPHOS 73 03/22/2018 1222   BILITOT 0.6 03/22/2018 1222   GFRNONAA >60 04/05/2018 1341   GFRAA >60  04/05/2018 1341       Component Value Date/Time   WBC 7.7 04/05/2018 1341   RBC 4.34 04/05/2018 1341   HGB 13.5 04/05/2018 1341   HCT 40.0 04/05/2018 1341   PLT 357 04/05/2018 1341   MCV 92.2 04/05/2018 1341   MCH 31.1 04/05/2018 1341   MCHC 33.8 04/05/2018 1341   RDW 13.5 04/05/2018 1341   LYMPHSABS 1.5 03/22/2018 1222   MONOABS 0.4 03/22/2018 1222   EOSABS 0.1 03/22/2018 1222   BASOSABS 0.0 03/22/2018 1222    No results found for: "POCLITH", "LITHIUM"   No results found for: "PHENYTOIN", "PHENOBARB", "VALPROATE", "CBMZ"   .res Assessment: Plan:    Plan:  Vyvanse 50mg  every morning Valium 5mg  TID - using as needed - none needed Gabapentin 600mg  4 times daily - RLS Prozac 50mg  (40 - 10) daily  Monitor BP between visits  RTC 3 months  Time spent with patient was 25 minutes. Greater than 50% of face to face time with patient was spent on counseling and coordination of care.   Patient advised to contact office with any questions, adverse effects, or acute worsening in signs and symptoms.  Discussed potential benefits, risk, and side effects of benzodiazepines to include potential risk of tolerance and dependence, as well as possible drowsiness.  Advised patient not to drive if experiencing drowsiness and to take lowest possible effective dose to minimize risk of dependence and  tolerance.  Discussed potential benefits, risks, and side effects of stimulants with patient to include increased heart rate, palpitations, insomnia, increased anxiety, increased irritability, or decreased appetite.  Instructed patient to contact office if experiencing any significant tolerability issues.  There are no diagnoses linked to this encounter.   Please see After Visit Summary for patient specific instructions.  Future Appointments  Date Time Provider Department Center  01/07/2022  8:00 AM Osric Klopf, , NP [facility] None    No orders of the defined types were placed in this encounter.     -------------------------------

## 2022-05-13 ENCOUNTER — Telehealth: Payer: Self-pay | Admitting: Adult Health

## 2022-05-13 NOTE — Telephone Encounter (Signed)
Patient called in for refill on Vyvanse 50mg . Ph: [place], [place]

## 2022-05-13 NOTE — Telephone Encounter (Signed)
Please call to schedule an appt, was due in September.

## 2022-05-14 ENCOUNTER — Other Ambulatory Visit: Payer: Self-pay

## 2022-05-14 DIAGNOSIS — F902 Attention-deficit hyperactivity disorder, combined type: Secondary | ICD-10-CM

## 2022-05-14 MED ORDER — LISDEXAMFETAMINE DIMESYLATE 50 MG PO CAPS: 50.0000 mg | ORAL_CAPSULE | Freq: Every day | ORAL | 0 refills | Status: DC

## 2022-05-14 NOTE — Telephone Encounter (Signed)
Pended.

## 2022-05-14 NOTE — Telephone Encounter (Signed)
Apt 11/9. Requesting RF

## 2022-05-21 ENCOUNTER — Telehealth (INDEPENDENT_AMBULATORY_CARE_PROVIDER_SITE_OTHER): Admitting: Adult Health

## 2022-05-21 ENCOUNTER — Encounter: Payer: Self-pay | Admitting: Adult Health

## 2022-05-21 DIAGNOSIS — F331 Major depressive disorder, recurrent, moderate: Secondary | ICD-10-CM | POA: Diagnosis not present

## 2022-05-21 DIAGNOSIS — F411 Generalized anxiety disorder: Secondary | ICD-10-CM

## 2022-05-21 DIAGNOSIS — G4700 Insomnia, unspecified: Secondary | ICD-10-CM

## 2022-05-21 DIAGNOSIS — F902 Attention-deficit hyperactivity disorder, combined type: Secondary | ICD-10-CM | POA: Diagnosis not present

## 2022-05-21 MED ORDER — GABAPENTIN 600 MG PO TABS: ORAL_TABLET | ORAL | 5 refills | Status: DC

## 2022-05-21 MED ORDER — LISDEXAMFETAMINE DIMESYLATE 50 MG PO CAPS: 50.0000 mg | ORAL_CAPSULE | Freq: Every day | ORAL | 0 refills | Status: DC

## 2022-05-21 MED ORDER — FLUOXETINE HCL 10 MG PO CAPS: 10.0000 mg | ORAL_CAPSULE | Freq: Every day | ORAL | 5 refills | Status: DC

## 2022-05-21 MED ORDER — LISDEXAMFETAMINE DIMESYLATE 50 MG PO CAPS: 50.0000 mg | ORAL_CAPSULE | ORAL | 0 refills | Status: DC

## 2022-05-21 MED ORDER — FLUOXETINE HCL 40 MG PO CAPS: ORAL_CAPSULE | ORAL | 5 refills | Status: DC

## 2022-05-21 NOTE — Progress Notes (Signed)
Kathryn Henry [id number] 1985/10/24 36 y.o.  Virtual Visit via Video Note  I connected with pt @ on 05/21/22 at  9:40 AM EST by a video enabled telemedicine application and verified that I am speaking with the correct person using two identifiers.   I discussed the limitations of evaluation and management by telemedicine and the availability of in person appointments. The patient expressed understanding and agreed to proceed.  I discussed the assessment and treatment plan with the patient. The patient was provided an opportunity to ask questions and all were answered. The patient agreed with the plan and demonstrated an understanding of the instructions.   The patient was advised to call back or seek an in-person evaluation if the symptoms worsen or if the condition fails to improve as anticipated.  I provided 25 minutes of non-face-to-face time during this encounter.  The patient was located at home.  The provider was located at [facility].   Kathryn Gibbs, NP   Subjective:   Patient ID:  Kathryn Henry is a 36 y.o. (DOB 1986-02-22) female.  Chief Complaint: No chief complaint on file.   HPI Kathryn Henry presents for follow-up of GAD, MDD, OCD, insomnia, and ADHD.  Describes mood today as "ok". Pleasant. Denies tearfulness. Mood symptoms - denies depression, anxiety, and irritability. Mood is consistent. Stating "I'm doing alright". Feels like medications continue to work well. Family doing well - recovering from Covid. Stable interest and motivation. Taking medications as prescribed.  Energy levels stable. Active, does not have a regular exercise routine.  Enjoys some usual interests and activities. Married. Lives with husband, daughter and dog. Appetite adequate. Weight loss - 150 from 153 pounds - 66". Sleeps well most nights. Averages 7 to 9 hours.  Focus and concentration stable. Completing tasks. Managing aspects of household. Stay at home mom. Denies SI  or HI.  Denies AH or VH.   Seeing Neurologist for headaches  Previous medications: Abilify, Celexa, Wellbutrin, Prozac, Latuda, Rexulti, Cogentin, Lamictal-rash  Review of Systems:  Review of Systems  Musculoskeletal:  Negative for gait problem.  Neurological:  Negative for tremors.  Psychiatric/Behavioral:         Please refer to HPI    Medications: I have reviewed the patient's current medications.  Current Outpatient Medications  Medication Sig Dispense Refill   diazepam (VALIUM) 5 MG tablet TAKE 1 TABLET(5 MG) BY MOUTH THREE TIMES DAILY AS NEEDED 90 tablet 2   drospirenone-ethinyl estradiol (YAZ) 3-0.02 MG tablet TK 1 T PO QD     FLUoxetine (PROZAC) 10 MG capsule Take 1 capsule (10 mg total) by mouth daily. 30 capsule 5   FLUoxetine (PROZAC) 40 MG capsule TAKE ONE CAPSULE BY MOUTH DAILY 30 capsule 5   gabapentin (NEURONTIN) 600 MG tablet TAKE 1 TABLET(600 MG) BY MOUTH FOUR TIMES DAILY 120 tablet 5   ketoconazole (NIZORAL) 2 % cream Apply 1 application topically daily.  2   lisdexamfetamine (VYVANSE) 50 MG capsule Take 1 capsule (50 mg total) by mouth every morning. 30 capsule 0   [START ON 06/18/2022] lisdexamfetamine (VYVANSE) 50 MG capsule Take 1 capsule (50 mg total) by mouth daily. 30 capsule 0   [START ON 07/16/2022] lisdexamfetamine (VYVANSE) 50 MG capsule Take 1 capsule (50 mg total) by mouth daily. 7 capsule 0   No current facility-administered medications for this visit.    Medication Side Effects: None  Allergies: No Known Allergies  No past medical history on file.  No family history on file.  Social  History   Socioeconomic History   Marital status: Married    Spouse name: Not on file   Number of children: Not on file   Years of education: Not on file   Highest education level: Not on file  Occupational History   Not on file  Tobacco Use   Smoking status: Former   Smokeless tobacco: Never  Vaping Use   Vaping Use: Never used  Substance and Sexual  Activity   Alcohol use: Never   Drug use: Never   Sexual activity: Not on file  Other Topics Concern   Not on file  Social History Narrative   Not on file   Social Determinants of Health   Financial Resource Strain: Not on file  Food Insecurity: Not on file  Transportation Needs: Not on file  Physical Activity: Not on file  Stress: Not on file  Social Connections: Not on file  Intimate Partner Violence: Not on file    Past Medical History, Surgical history, Social history, and Family history were reviewed and updated as appropriate.   Please see review of systems for further details on the patient's review from today.   Objective:   Physical Exam:  There were no vitals taken for this visit.  Physical Exam Constitutional:      General: She is not in acute distress. Musculoskeletal:        General: No deformity.  Neurological:     Mental Status: She is alert and oriented to person, place, and time.     Coordination: Coordination normal.  Psychiatric:        Attention and Perception: Attention and perception normal. She does not perceive auditory or visual hallucinations.        Mood and Affect: Mood normal. Mood is not anxious or depressed. Affect is not labile, blunt, angry or inappropriate.        Speech: Speech normal.        Behavior: Behavior normal.        Thought Content: Thought content normal. Thought content is not paranoid or delusional. Thought content does not include homicidal or suicidal ideation. Thought content does not include homicidal or suicidal plan.        Cognition and Memory: Cognition and memory normal.        Judgment: Judgment normal.     Comments: Insight intact     Lab Review:     Component Value Date/Time   NA 141 04/05/2018 1341   K 3.5 04/05/2018 1341   CL 104 04/05/2018 1341   CO2 27 04/05/2018 1341   GLUCOSE 92 04/05/2018 1341   BUN 10 04/05/2018 1341   CREATININE 0.65 04/05/2018 1341   CALCIUM 8.9 04/05/2018 1341   PROT 6.5  03/22/2018 1222   ALBUMIN 3.6 03/22/2018 1222   AST 19 03/22/2018 1222   ALT 14 03/22/2018 1222   ALKPHOS 73 03/22/2018 1222   BILITOT 0.6 03/22/2018 1222   GFRNONAA >60 04/05/2018 1341   GFRAA >60 04/05/2018 1341       Component Value Date/Time   WBC 7.7 04/05/2018 1341   RBC 4.34 04/05/2018 1341   HGB 13.5 04/05/2018 1341   HCT 40.0 04/05/2018 1341   PLT 357 04/05/2018 1341   MCV 92.2 04/05/2018 1341   MCH 31.1 04/05/2018 1341   MCHC 33.8 04/05/2018 1341   RDW 13.5 04/05/2018 1341   LYMPHSABS 1.5 03/22/2018 1222   MONOABS 0.4 03/22/2018 1222   EOSABS 0.1 03/22/2018 1222   BASOSABS 0.0 03/22/2018  1222    No results found for: "POCLITH", "LITHIUM"   No results found for: "PHENYTOIN", "PHENOBARB", "VALPROATE", "CBMZ"   .res Assessment: Plan:    Plan:  Vyvanse 50mg  every morning - taking daily Gabapentin 600mg  4 times daily - RLS Prozac 50mg  (40 - 10) daily  Valium 5mg  up to twice a week - using as needed for migraines - none needed  Monitor BP between visits  RTC 3 months  Time spent with patient was 25 minutes. Greater than 50% of face to face time with patient was spent on counseling and coordination of care.   Patient advised to contact office with any questions, adverse effects, or acute worsening in signs and symptoms.  Discussed potential benefits, risk, and side effects of benzodiazepines to include potential risk of tolerance and dependence, as well as possible drowsiness.  Advised patient not to drive if experiencing drowsiness and to take lowest possible effective dose to minimize risk of dependence and tolerance.  Discussed potential benefits, risks, and side effects of stimulants with patient to include increased heart rate, palpitations, insomnia, increased anxiety, increased irritability, or decreased appetite.  Instructed patient to contact office if experiencing any significant tolerability issues.  Diagnoses and all orders for this visit:  Major  depressive disorder, recurrent episode, moderate (HCC) -     FLUoxetine (PROZAC) 40 MG capsule; TAKE ONE CAPSULE BY MOUTH DAILY -     FLUoxetine (PROZAC) 10 MG capsule; Take 1 capsule (10 mg total) by mouth daily.  Attention deficit hyperactivity disorder (ADHD), combined type -     lisdexamfetamine (VYVANSE) 50 MG capsule; Take 1 capsule (50 mg total) by mouth every morning. -     lisdexamfetamine (VYVANSE) 50 MG capsule; Take 1 capsule (50 mg total) by mouth daily. -     lisdexamfetamine (VYVANSE) 50 MG capsule; Take 1 capsule (50 mg total) by mouth daily.  Insomnia, unspecified type -     gabapentin (NEURONTIN) 600 MG tablet; TAKE 1 TABLET(600 MG) BY MOUTH FOUR TIMES DAILY  Generalized anxiety disorder -     FLUoxetine (PROZAC) 40 MG capsule; TAKE ONE CAPSULE BY MOUTH DAILY -     gabapentin (NEURONTIN) 600 MG tablet; TAKE 1 TABLET(600 MG) BY MOUTH FOUR TIMES DAILY     Please see After Visit Summary for patient specific instructions.  No future appointments.  No orders of the defined types were placed in this encounter.     -------------------------------

## 2022-07-23 ENCOUNTER — Telehealth: Payer: Self-pay | Admitting: Adult Health

## 2022-07-23 ENCOUNTER — Other Ambulatory Visit: Payer: Self-pay

## 2022-07-23 NOTE — Telephone Encounter (Signed)
Pt called at 9:10a.  She would like refill of Vyvanse sent to [facility] on [address] in [place].    Next appt 1/12

## 2022-07-23 NOTE — Telephone Encounter (Signed)
Pt has rx at pharmacy 

## 2022-07-24 ENCOUNTER — Encounter: Payer: Self-pay | Admitting: Adult Health

## 2022-07-24 ENCOUNTER — Telehealth (INDEPENDENT_AMBULATORY_CARE_PROVIDER_SITE_OTHER): Admitting: Adult Health

## 2022-07-24 DIAGNOSIS — F331 Major depressive disorder, recurrent, moderate: Secondary | ICD-10-CM | POA: Diagnosis not present

## 2022-07-24 DIAGNOSIS — G4700 Insomnia, unspecified: Secondary | ICD-10-CM | POA: Diagnosis not present

## 2022-07-24 DIAGNOSIS — F411 Generalized anxiety disorder: Secondary | ICD-10-CM | POA: Diagnosis not present

## 2022-07-24 DIAGNOSIS — F902 Attention-deficit hyperactivity disorder, combined type: Secondary | ICD-10-CM | POA: Diagnosis not present

## 2022-07-24 MED ORDER — LISDEXAMFETAMINE DIMESYLATE 70 MG PO CAPS: 70.0000 mg | ORAL_CAPSULE | Freq: Every day | ORAL | 0 refills | Status: DC

## 2022-07-24 MED ORDER — FLUOXETINE HCL 40 MG PO CAPS: ORAL_CAPSULE | ORAL | 5 refills | Status: DC

## 2022-07-24 MED ORDER — LISDEXAMFETAMINE DIMESYLATE 70 MG PO CAPS: 70.0000 mg | ORAL_CAPSULE | ORAL | 0 refills | Status: DC

## 2022-07-24 MED ORDER — FLUOXETINE HCL 10 MG PO CAPS: 10.0000 mg | ORAL_CAPSULE | Freq: Every day | ORAL | 5 refills | Status: DC

## 2022-07-24 MED ORDER — GABAPENTIN 600 MG PO TABS: ORAL_TABLET | ORAL | 5 refills | Status: DC

## 2022-07-24 NOTE — Progress Notes (Signed)
Kathryn Henry [id number] [birth year] 37 y.o.  Virtual Visit via Video Note  I connected with pt @ on 07/24/22 at  2:00 PM EST by a video enabled telemedicine application and verified that I am speaking with the correct person using two identifiers.   I discussed the limitations of evaluation and management by telemedicine and the availability of in person appointments. The patient expressed understanding and agreed to proceed.  I discussed the assessment and treatment plan with the patient. The patient was provided an opportunity to ask questions and all were answered. The patient agreed with the plan and demonstrated an understanding of the instructions.   The patient was advised to call back or seek an in-person evaluation if the symptoms worsen or if the condition fails to improve as anticipated.  I provided 25 minutes of non-face-to-face time during this encounter.  The patient was located at home.  The provider was located at [place].   Aloha Gell, NP   Subjective:   Patient ID:  Kathryn Henry is a 37 y.o. (DOB 03/14/1986) female.  Chief Complaint: No chief complaint on file.   HPI Kathryn Henry presents for follow-up of GAD, MDD, OCD, insomnia, and ADHD.  Describes mood today as "ok". Pleasant. Denies tearfulness. Mood symptoms - denies depression, anxiety, and irritability. Mood is consistent. Stating "I'm doing pretty good". Feels like medications continue to work well. Family doing well. Stable interest and motivation. Taking medications as prescribed.  Energy levels lower. Active, does not have a regular exercise routine.  Enjoys some usual interests and activities. Married. Lives with husband, daughter and dog. Appetite adequate. Weight loss - 155 pounds - 66". Sleeps well most nights. Averages 7 to 9 hours.  Focus and concentration difficulties. Completing tasks. Managing aspects of household. Stay at home mom. Denies SI or HI.  Denies AH or VH.    Seeing Neurologist for headaches  Previous medications: Abilify, Celexa, Wellbutrin, Prozac, Latuda, Rexulti, Cogentin, Lamictal-rash  Review of Systems:  Review of Systems  Musculoskeletal:  Negative for gait problem.  Neurological:  Negative for tremors.  Psychiatric/Behavioral:         Please refer to HPI    Medications: I have reviewed the patient's current medications.  Current Outpatient Medications  Medication Sig Dispense Refill   diazepam (VALIUM) 5 MG tablet TAKE 1 TABLET(5 MG) BY MOUTH THREE TIMES DAILY AS NEEDED 90 tablet 2   drospirenone-ethinyl estradiol (YAZ) 3-0.02 MG tablet TK 1 T PO QD     FLUoxetine (PROZAC) 10 MG capsule Take 1 capsule (10 mg total) by mouth daily. 30 capsule 5   FLUoxetine (PROZAC) 40 MG capsule TAKE ONE CAPSULE BY MOUTH DAILY 30 capsule 5   gabapentin (NEURONTIN) 600 MG tablet TAKE 1 TABLET(600 MG) BY MOUTH FOUR TIMES DAILY 120 tablet 5   ketoconazole (NIZORAL) 2 % cream Apply 1 application topically daily.  2   lisdexamfetamine (VYVANSE) 70 MG capsule Take 1 capsule (70 mg total) by mouth daily. 30 capsule 0   [START ON 08/21/2022] lisdexamfetamine (VYVANSE) 70 MG capsule Take 1 capsule (70 mg total) by mouth daily. 30 capsule 0   [START ON 09/18/2022] lisdexamfetamine (VYVANSE) 70 MG capsule Take 1 capsule (70 mg total) by mouth every morning. 30 capsule 0   No current facility-administered medications for this visit.    Medication Side Effects: None  Allergies: No Known Allergies  No past medical history on file.  No family history on file.  Social History   Socioeconomic History  Marital status: Married    Spouse name: Not on file   Number of children: Not on file   Years of education: Not on file   Highest education level: Not on file  Occupational History   Not on file  Tobacco Use   Smoking status: Former   Smokeless tobacco: Never  Vaping Use   Vaping Use: Never used  Substance and Sexual Activity   Alcohol use:  Never   Drug use: Never   Sexual activity: Not on file  Other Topics Concern   Not on file  Social History Narrative   Not on file   Social Determinants of Health   Financial Resource Strain: Not on file  Food Insecurity: Not on file  Transportation Needs: Not on file  Physical Activity: Not on file  Stress: Not on file  Social Connections: Not on file  Intimate Partner Violence: Not on file    Past Medical History, Surgical history, Social history, and Family history were reviewed and updated as appropriate.   Please see review of systems for further details on the patient's review from today.   Objective:   Physical Exam:  There were no vitals taken for this visit.  Physical Exam Constitutional:      General: She is not in acute distress. Musculoskeletal:        General: No deformity.  Neurological:     Mental Status: She is alert and oriented to person, place, and time.     Coordination: Coordination normal.  Psychiatric:        Attention and Perception: Attention and perception normal. She does not perceive auditory or visual hallucinations.        Mood and Affect: Mood normal. Mood is not anxious or depressed. Affect is not labile, blunt, angry or inappropriate.        Speech: Speech normal.        Behavior: Behavior normal.        Thought Content: Thought content normal. Thought content is not paranoid or delusional. Thought content does not include homicidal or suicidal ideation. Thought content does not include homicidal or suicidal plan.        Cognition and Memory: Cognition and memory normal.        Judgment: Judgment normal.     Comments: Insight intact     Lab Review:     Component Value Date/Time   NA 141 04/05/2018 1341   K 3.5 04/05/2018 1341   CL 104 04/05/2018 1341   CO2 27 04/05/2018 1341   GLUCOSE 92 04/05/2018 1341   BUN 10 04/05/2018 1341   CREATININE 0.65 04/05/2018 1341   CALCIUM 8.9 04/05/2018 1341   PROT 6.5 03/22/2018 1222    ALBUMIN 3.6 03/22/2018 1222   AST 19 03/22/2018 1222   ALT 14 03/22/2018 1222   ALKPHOS 73 03/22/2018 1222   BILITOT 0.6 03/22/2018 1222   GFRNONAA >60 04/05/2018 1341   GFRAA >60 04/05/2018 1341       Component Value Date/Time   WBC 7.7 04/05/2018 1341   RBC 4.34 04/05/2018 1341   HGB 13.5 04/05/2018 1341   HCT 40.0 04/05/2018 1341   PLT 357 04/05/2018 1341   MCV 92.2 04/05/2018 1341   MCH 31.1 04/05/2018 1341   MCHC 33.8 04/05/2018 1341   RDW 13.5 04/05/2018 1341   LYMPHSABS 1.5 03/22/2018 1222   MONOABS 0.4 03/22/2018 1222   EOSABS 0.1 03/22/2018 1222   BASOSABS 0.0 03/22/2018 1222    No results found  for: "POCLITH", "LITHIUM"   No results found for: "PHENYTOIN", "PHENOBARB", "VALPROATE", "CBMZ"   .res Assessment: Plan:    Plan:  Increase Vyvanse 70mg  every morning - taking daily Gabapentin 600mg  4 times daily - RLS Prozac 50mg  (40 - 10) daily  Valium 5mg  up to twice a week - using as needed for migraines - none needed  Monitor BP between visits  RTC 3 months  Time spent with patient was 25 minutes. Greater than 50% of face to face time with patient was spent on counseling and coordination of care.   Patient advised to contact office with any questions, adverse effects, or acute worsening in signs and symptoms.  Discussed potential benefits, risk, and side effects of benzodiazepines to include potential risk of tolerance and dependence, as well as possible drowsiness.  Advised patient not to drive if experiencing drowsiness and to take lowest possible effective dose to minimize risk of dependence and tolerance.  Discussed potential benefits, risks, and side effects of stimulants with patient to include increased heart rate, palpitations, insomnia, increased anxiety, increased irritability, or decreased appetite.  Instructed patient to contact office if experiencing any significant tolerability issues.  Diagnoses and all orders for this visit:  Major  depressive disorder, recurrent episode, moderate (HCC) -     FLUoxetine (PROZAC) 40 MG capsule; TAKE ONE CAPSULE BY MOUTH DAILY -     FLUoxetine (PROZAC) 10 MG capsule; Take 1 capsule (10 mg total) by mouth daily.  Generalized anxiety disorder -     FLUoxetine (PROZAC) 40 MG capsule; TAKE ONE CAPSULE BY MOUTH DAILY -     gabapentin (NEURONTIN) 600 MG tablet; TAKE 1 TABLET(600 MG) BY MOUTH FOUR TIMES DAILY  Insomnia, unspecified type -     gabapentin (NEURONTIN) 600 MG tablet; TAKE 1 TABLET(600 MG) BY MOUTH FOUR TIMES DAILY  Attention deficit hyperactivity disorder (ADHD), combined type -     lisdexamfetamine (VYVANSE) 70 MG capsule; Take 1 capsule (70 mg total) by mouth daily. -     lisdexamfetamine (VYVANSE) 70 MG capsule; Take 1 capsule (70 mg total) by mouth daily. -     lisdexamfetamine (VYVANSE) 70 MG capsule; Take 1 capsule (70 mg total) by mouth every morning.     Please see After Visit Summary for patient specific instructions.  No future appointments.  No orders of the defined types were placed in this encounter.     -------------------------------

## 2022-08-13 ENCOUNTER — Telehealth: Payer: Self-pay | Admitting: Physician Assistant

## 2022-08-13 DIAGNOSIS — J019 Acute sinusitis, unspecified: Secondary | ICD-10-CM

## 2022-08-13 DIAGNOSIS — B9689 Other specified bacterial agents as the cause of diseases classified elsewhere: Secondary | ICD-10-CM

## 2022-08-13 MED ORDER — AMOXICILLIN-POT CLAVULANATE 875-125 MG PO TABS: 1.0000 | ORAL_TABLET | Freq: Two times a day (BID) | ORAL | 0 refills | Status: AC

## 2022-08-13 MED ORDER — FLUTICASONE PROPIONATE 50 MCG/ACT NA SUSP: 2.0000 | Freq: Every day | NASAL | 0 refills | Status: AC

## 2022-08-13 NOTE — Progress Notes (Signed)
Virtual Visit Consent   Evelette Hollern, you are scheduled for a virtual visit with a [facility] provider today. Just as with appointments in the office, your consent must be obtained to participate. Your consent will be active for this visit and any virtual visit you may have with one of our providers in the next 365 days. If you have a MyChart account, a copy of this consent can be sent to you electronically.  As this is a virtual visit, video technology does not allow for your provider to perform a traditional examination. This may limit your provider's ability to fully assess your condition. If your provider identifies any concerns that need to be evaluated in person or the need to arrange testing (such as labs, EKG, etc.), we will make arrangements to do so. Although advances in technology are sophisticated, we cannot ensure that it will always work on either your end or our end. If the connection with a video visit is poor, the visit may have to be switched to a telephone visit. With either a video or telephone visit, we are not always able to ensure that we have a secure connection.  By engaging in this virtual visit, you consent to the provision of healthcare and authorize for your insurance to be billed (if applicable) for the services provided during this visit. Depending on your insurance coverage, you may receive a charge related to this service.  I need to obtain your verbal consent now. Are you willing to proceed with your visit today? Sherrise Liberto has provided verbal consent on 08/13/2022 for a virtual visit (video or telephone). Leeanne Rio, [place]  Date: 08/13/2022 3:56 PM  Virtual Visit via Video Note   I, Leeanne Rio, connected with  Hermine Feria  ([id number], July 21, 1985) on 08/13/22 at  4:00 PM EST by a video-enabled telemedicine application and verified that I am speaking with the correct person using two identifiers.  Location: Patient: Virtual Visit Location  Patient: Home Provider: Virtual Visit Location Provider: Home Office   I discussed the limitations of evaluation and management by telemedicine and the availability of in person appointments. The patient expressed understanding and agreed to proceed.    History of Present Illness: Kathryn Henry is a 37 y.o. who identifies as a female who was assigned female at birth, and is being seen today for possible sinusitis. Patient endorses symptoms starting last week with nasal congestion, sinus and forehead pressure. Note sinus pain. Denies fevers but notes chills. Denies substantial chest congestion or cough. .   OTC -- Tylenol, Nyquil. Tried Sudafed today.     HPI: HPI  Problems:  Patient Active Problem List   Diagnosis Date Noted   GAD (generalized anxiety disorder) 02/27/2019   MDD (major depressive disorder) 02/27/2019   Insomnia, unspecified 02/27/2019   ADD (attention deficit disorder) 04/28/2018    Allergies: No Known Allergies Medications:  Current Outpatient Medications:    amoxicillin-clavulanate (AUGMENTIN) 875-125 MG tablet, Take 1 tablet by mouth 2 (two) times daily., Disp: 14 tablet, Rfl: 0   diazepam (VALIUM) 5 MG tablet, TAKE 1 TABLET(5 MG) BY MOUTH THREE TIMES DAILY AS NEEDED, Disp: 90 tablet, Rfl: 2   drospirenone-ethinyl estradiol (YAZ) 3-0.02 MG tablet, TK 1 T PO QD, Disp: , Rfl:    FLUoxetine (PROZAC) 10 MG capsule, Take 1 capsule (10 mg total) by mouth daily., Disp: 30 capsule, Rfl: 5   FLUoxetine (PROZAC) 40 MG capsule, TAKE ONE CAPSULE BY MOUTH DAILY, Disp: 30 capsule, Rfl: 5  fluticasone (FLONASE) 50 MCG/ACT nasal spray, Place 2 sprays into both nostrils daily., Disp: 16 g, Rfl: 0   gabapentin (NEURONTIN) 600 MG tablet, TAKE 1 TABLET(600 MG) BY MOUTH FOUR TIMES DAILY, Disp: 120 tablet, Rfl: 5   ketoconazole (NIZORAL) 2 % cream, Apply 1 application topically daily., Disp: , Rfl: 2   lisdexamfetamine (VYVANSE) 70 MG capsule, Take 1 capsule (70 mg total) by mouth  daily., Disp: 30 capsule, Rfl: 0   [START ON 08/21/2022] lisdexamfetamine (VYVANSE) 70 MG capsule, Take 1 capsule (70 mg total) by mouth daily., Disp: 30 capsule, Rfl: 0   [START ON 09/18/2022] lisdexamfetamine (VYVANSE) 70 MG capsule, Take 1 capsule (70 mg total) by mouth every morning., Disp: 30 capsule, Rfl: 0  Observations/Objective: Patient is well-developed, well-nourished in no acute distress.  Resting comfortably at home.  Head is normocephalic, atraumatic.  No labored breathing. Speech is clear and coherent with logical content.  Patient is alert and oriented at baseline.   Assessment and Plan: 1. Acute bacterial sinusitis - fluticasone (FLONASE) 50 MCG/ACT nasal spray; Place 2 sprays into both nostrils daily.  Dispense: 16 g; Refill: 0 - amoxicillin-clavulanate (AUGMENTIN) 875-125 MG tablet; Take 1 tablet by mouth 2 (two) times daily.  Dispense: 14 tablet; Refill: 0  Rx Augmentin.  Increase fluids.  Rest.  Saline nasal spray.  Probiotic.  Mucinex as directed.  Humidifier in bedroom. Flonase per orders.  Call or return to clinic if symptoms are not improving.   Follow Up Instructions: I discussed the assessment and treatment plan with the patient. The patient was provided an opportunity to ask questions and all were answered. The patient agreed with the plan and demonstrated an understanding of the instructions.  A copy of instructions were sent to the patient via MyChart unless otherwise noted below.   The patient was advised to call back or seek an in-person evaluation if the symptoms worsen or if the condition fails to improve as anticipated.  Time:  I spent 10 minutes with the patient via telehealth technology discussing the above problems/concerns.    Leeanne Rio, PA-C

## 2022-08-13 NOTE — Patient Instructions (Signed)
Lennice Sites, thank you for joining Leeanne Rio, PA-C for today's virtual visit.  While this provider is not your primary care provider (PCP), if your PCP is located in our provider database this encounter information will be shared with them immediately following your visit.   [facility] account gives you access to today's visit and all your visits, tests, and labs performed at [facility] " click here if you don't have a [facility] account or go to [email or web address]  Consent: (Patient) Kathryn Henry provided verbal consent for this virtual visit at the beginning of the encounter.  Current Medications:  Current Outpatient Medications:    diazepam (VALIUM) 5 MG tablet, TAKE 1 TABLET(5 MG) BY MOUTH THREE TIMES DAILY AS NEEDED, Disp: 90 tablet, Rfl: 2   drospirenone-ethinyl estradiol (YAZ) 3-0.02 MG tablet, TK 1 T PO QD, Disp: , Rfl:    FLUoxetine (PROZAC) 10 MG capsule, Take 1 capsule (10 mg total) by mouth daily., Disp: 30 capsule, Rfl: 5   FLUoxetine (PROZAC) 40 MG capsule, TAKE ONE CAPSULE BY MOUTH DAILY, Disp: 30 capsule, Rfl: 5   gabapentin (NEURONTIN) 600 MG tablet, TAKE 1 TABLET(600 MG) BY MOUTH FOUR TIMES DAILY, Disp: 120 tablet, Rfl: 5   ketoconazole (NIZORAL) 2 % cream, Apply 1 application topically daily., Disp: , Rfl: 2   lisdexamfetamine (VYVANSE) 70 MG capsule, Take 1 capsule (70 mg total) by mouth daily., Disp: 30 capsule, Rfl: 0   [START ON 08/21/2022] lisdexamfetamine (VYVANSE) 70 MG capsule, Take 1 capsule (70 mg total) by mouth daily., Disp: 30 capsule, Rfl: 0   [START ON 09/18/2022] lisdexamfetamine (VYVANSE) 70 MG capsule, Take 1 capsule (70 mg total) by mouth every morning., Disp: 30 capsule, Rfl: 0   Medications ordered in this encounter:  No orders of the defined types were placed in this encounter.    *If you need refills on other medications prior to your next appointment, please contact your  pharmacy*  Follow-Up: Call back or seek an in-person evaluation if the symptoms worsen or if the condition fails to improve as anticipated.  [place] [phone]  Other Instructions Please take antibiotic as directed.  Increase fluid intake.  Use Saline nasal spray.  Take a daily multivitamin. Start the [facility] daily as directed. Ok to continue sudafed OTC.  Place a humidifier in the bedroom.  Please call or return clinic if symptoms are not improving.  Sinusitis Sinusitis is redness, soreness, and swelling (inflammation) of the paranasal sinuses. Paranasal sinuses are air pockets within the bones of your face (beneath the eyes, the middle of the forehead, or above the eyes). In healthy paranasal sinuses, mucus is able to drain out, and air is able to circulate through them by way of your nose. However, when your paranasal sinuses are inflamed, mucus and air can become trapped. This can allow bacteria and other germs to grow and cause infection. Sinusitis can develop quickly and last only a short time (acute) or continue over a long period (chronic). Sinusitis that lasts for more than 12 weeks is considered chronic.  CAUSES  Causes of sinusitis include: Allergies. Structural abnormalities, such as displacement of the cartilage that separates your nostrils (deviated septum), which can decrease the air flow through your nose and sinuses and affect sinus drainage. Functional abnormalities, such as when the small hairs (cilia) that line your sinuses and help remove mucus do not work properly or are not present. SYMPTOMS  Symptoms of acute and chronic sinusitis  are the same. The primary symptoms are pain and pressure around the affected sinuses. Other symptoms include: Upper toothache. Earache. Headache. Bad breath. Decreased sense of smell and taste. A cough, which worsens when you are lying flat. Fatigue. Fever. Thick drainage from your nose, which often is green and may  contain pus (purulent). Swelling and warmth over the affected sinuses. DIAGNOSIS  Your caregiver will perform a physical exam. During the exam, your caregiver may: Look in your nose for signs of abnormal growths in your nostrils (nasal polyps). Tap over the affected sinus to check for signs of infection. View the inside of your sinuses (endoscopy) with a special imaging device with a light attached (endoscope), which is inserted into your sinuses. If your caregiver suspects that you have chronic sinusitis, one or more of the following tests may be recommended: Allergy tests. Nasal culture A sample of mucus is taken from your nose and sent to a lab and screened for bacteria. Nasal cytology A sample of mucus is taken from your nose and examined by your caregiver to determine if your sinusitis is related to an allergy. TREATMENT  Most cases of acute sinusitis are related to a viral infection and will resolve on their own within 10 days. Sometimes medicines are prescribed to help relieve symptoms (pain medicine, decongestants, nasal steroid sprays, or saline sprays).  However, for sinusitis related to a bacterial infection, your caregiver will prescribe antibiotic medicines. These are medicines that will help kill the bacteria causing the infection.  Rarely, sinusitis is caused by a fungal infection. In theses cases, your caregiver will prescribe antifungal medicine. For some cases of chronic sinusitis, surgery is needed. Generally, these are cases in which sinusitis recurs more than 3 times per year, despite other treatments. HOME CARE INSTRUCTIONS  Drink plenty of water. Water helps thin the mucus so your sinuses can drain more easily. Use a humidifier. Inhale steam 3 to 4 times a day (for example, sit in the bathroom with the shower running). Apply a warm, moist washcloth to your face 3 to 4 times a day, or as directed by your caregiver. Use saline nasal sprays to help moisten and clean your  sinuses. Take over-the-counter or prescription medicines for pain, discomfort, or fever only as directed by your caregiver. SEEK IMMEDIATE MEDICAL CARE IF: You have increasing pain or severe headaches. You have nausea, vomiting, or drowsiness. You have swelling around your face. You have vision problems. You have a stiff neck. You have difficulty breathing. MAKE SURE YOU:  Understand these instructions. Will watch your condition. Will get help right away if you are not doing well or get worse. Document Released: 06/29/2005 Document Revised: 09/21/2011 Document Reviewed: 07/14/2011 [facility] Patient Information 2014 [place], [place].    If you have been instructed to have an in-person evaluation today at a local Urgent Care facility, please use the link below. It will take you to a list of all of our available [facility], including address, phone number and hours of operation. Please do not delay care.  [facility]  If you or a family member do not have a primary care provider, use the link below to schedule a visit and establish care. When you choose a [facility] primary care physician or advanced practice provider, you gain a long-term partner in health. Find a Primary Care Provider  Learn more about [facility]'s in-office and virtual care options: [facility]

## 2022-10-23 ENCOUNTER — Telehealth (INDEPENDENT_AMBULATORY_CARE_PROVIDER_SITE_OTHER): Admitting: Adult Health

## 2022-10-23 ENCOUNTER — Encounter: Payer: Self-pay | Admitting: Adult Health

## 2022-10-23 DIAGNOSIS — F902 Attention-deficit hyperactivity disorder, combined type: Secondary | ICD-10-CM

## 2022-10-23 DIAGNOSIS — F331 Major depressive disorder, recurrent, moderate: Secondary | ICD-10-CM | POA: Diagnosis not present

## 2022-10-23 DIAGNOSIS — G4700 Insomnia, unspecified: Secondary | ICD-10-CM | POA: Diagnosis not present

## 2022-10-23 DIAGNOSIS — F411 Generalized anxiety disorder: Secondary | ICD-10-CM

## 2022-10-23 MED ORDER — LISDEXAMFETAMINE DIMESYLATE 70 MG PO CAPS: 70.0000 mg | ORAL_CAPSULE | Freq: Every day | ORAL | 0 refills | Status: DC

## 2022-10-23 MED ORDER — LISDEXAMFETAMINE DIMESYLATE 70 MG PO CAPS: 70.0000 mg | ORAL_CAPSULE | ORAL | 0 refills | Status: DC

## 2022-10-23 NOTE — Progress Notes (Signed)
Kathryn Henry [id number] Aug 09, 1985 37 y.o.  Virtual Visit via Video Note  I connected with pt @ on 10/23/22 at 10:00 AM EDT by a video enabled telemedicine application and verified that I am speaking with the correct person using two identifiers.   I discussed the limitations of evaluation and management by telemedicine and the availability of in person appointments. The patient expressed understanding and agreed to proceed.  I discussed the assessment and treatment plan with the patient. The patient was provided an opportunity to ask questions and all were answered. The patient agreed with the plan and demonstrated an understanding of the instructions.   The patient was advised to call back or seek an in-person evaluation if the symptoms worsen or if the condition fails to improve as anticipated.  I provided 25 minutes of non-face-to-face time during this encounter.  The patient was located at home.  The provider was located at [facility].   [name], NP   Subjective:   Patient ID:  Kathryn Henry is a 37 y.o. (DOB 06/01/1986) female.  Chief Complaint: No chief complaint on file.   HPI Kathryn Henry presents for follow-up of GAD, MDD, OCD, insomnia, and ADHD.  Describes mood today as "ok". Pleasant. Denies tearfulness. Mood symptoms - denies depression, anxiety, and irritability. Denies worry, rumination, and over thinking. Mood is consistent. Stating "I'm doing ok". Feels like medications are helpful. Family doing well. Stable interest and motivation. Taking medications as prescribed.  Energy levels lower. Active, does not have a regular exercise routine.  Enjoys some usual interests and activities. Married. Lives with husband, daughter and dog. Appetite adequate. Weight loss - 155 pounds - 66". Sleeps well most nights. Averages 7 to 9 hours.  Focus and concentration stable - does not feel like the increase in Vyvanse from  to  was helpful. Also  notes more effectiveness when eating less. Completing tasks. Managing aspects of household. Stay at home mom. Denies SI or HI.  Denies AH or VH.  Denies self harm. Denies substance use.  Seeing Neurologist for headaches  Previous medications: Abilify, Celexa, Wellbutrin, Prozac, Latuda, Rexulti, Cogentin, Lamictal-rash   Review of Systems:  Review of Systems  Musculoskeletal:  Negative for gait problem.  Neurological:  Negative for tremors.  Psychiatric/Behavioral:         Please refer to HPI    Medications: I have reviewed the patient's current medications.  Current Outpatient Medications  Medication Sig Dispense Refill   amoxicillin-clavulanate (AUGMENTIN) 875-125 MG tablet Take 1 tablet by mouth 2 (two) times daily. 14 tablet 0   diazepam (VALIUM) 5 MG tablet TAKE 1 TABLET(5 MG) BY MOUTH THREE TIMES DAILY AS NEEDED 90 tablet 2   drospirenone-ethinyl estradiol (YAZ) 3-0.02 MG tablet TK 1 T PO QD     FLUoxetine (PROZAC) 10 MG capsule Take 1 capsule (10 mg total) by mouth daily. 30 capsule 5   FLUoxetine (PROZAC) 40 MG capsule TAKE ONE CAPSULE BY MOUTH DAILY 30 capsule 5   fluticasone (FLONASE) 50 MCG/ACT nasal spray Place 2 sprays into both nostrils daily. 16 g 0   gabapentin (NEURONTIN) 600 MG tablet TAKE 1 TABLET(600 MG) BY MOUTH FOUR TIMES DAILY 120 tablet 5   ketoconazole (NIZORAL) 2 % cream Apply 1 application topically daily.  2   lisdexamfetamine (VYVANSE) 70 MG capsule Take 1 capsule (70 mg total) by mouth daily. 30 capsule 0   lisdexamfetamine (VYVANSE) 70 MG capsule Take 1 capsule (70 mg total) by mouth daily. 30 capsule  0   lisdexamfetamine (VYVANSE) 70 MG capsule Take 1 capsule (70 mg total) by mouth every morning. 30 capsule 0   No current facility-administered medications for this visit.    Medication Side Effects: None  Allergies: No Known Allergies  No past medical history on file.  No family history on file.  Social History   Socioeconomic History    Marital status: Married    Spouse name: Not on file   Number of children: Not on file   Years of education: Not on file   Highest education level: Not on file  Occupational History   Not on file  Tobacco Use   Smoking status: Former   Smokeless tobacco: Never  Vaping Use   Vaping Use: Never used  Substance and Sexual Activity   Alcohol use: Never   Drug use: Never   Sexual activity: Not on file  Other Topics Concern   Not on file  Social History Narrative   Not on file   Social Determinants of Health   Financial Resource Strain: Not on file  Food Insecurity: Not on file  Transportation Needs: Not on file  Physical Activity: Not on file  Stress: Not on file  Social Connections: Not on file  Intimate Partner Violence: Not on file    Past Medical History, Surgical history, Social history, and Family history were reviewed and updated as appropriate.   Please see review of systems for further details on the patient's review from today.   Objective:   Physical Exam:  There were no vitals taken for this visit.  Physical Exam  Lab Review:     Component Value Date/Time   NA 141 04/05/2018 1341   K 3.5 04/05/2018 1341   CL 104 04/05/2018 1341   CO2 27 04/05/2018 1341   GLUCOSE 92 04/05/2018 1341   BUN 10 04/05/2018 1341   CREATININE 0.65 04/05/2018 1341   CALCIUM 8.9 04/05/2018 1341   PROT 6.5 03/22/2018 1222   ALBUMIN 3.6 03/22/2018 1222   AST 19 03/22/2018 1222   ALT 14 03/22/2018 1222   ALKPHOS 73 03/22/2018 1222   BILITOT 0.6 03/22/2018 1222   GFRNONAA >60 04/05/2018 1341   GFRAA >60 04/05/2018 1341       Component Value Date/Time   WBC 7.7 04/05/2018 1341   RBC 4.34 04/05/2018 1341   HGB 13.5 04/05/2018 1341   HCT 40.0 04/05/2018 1341   PLT 357 04/05/2018 1341   MCV 92.2 04/05/2018 1341   MCH 31.1 04/05/2018 1341   MCHC 33.8 04/05/2018 1341   RDW 13.5 04/05/2018 1341   LYMPHSABS 1.5 03/22/2018 1222   MONOABS 0.4 03/22/2018 1222   EOSABS 0.1  03/22/2018 1222   BASOSABS 0.0 03/22/2018 1222    No results found for: "POCLITH", "LITHIUM"   No results found for: "PHENYTOIN", "PHENOBARB", "VALPROATE", "CBMZ"   .res Assessment: Plan:    Plan:  Vyvanse 70mg  every morning  Gabapentin 600mg  4 times daily - RLS Prozac 50mg  (40 - 10) daily  Valium 5mg  up to twice a week - using as needed for migraines - none needed  Monitor BP between visits  RTC 3 months  Time spent with patient was 25 minutes. Greater than 50% of face to face time with patient was spent on counseling and coordination of care.   Patient advised to contact office with any questions, adverse effects, or acute worsening in signs and symptoms.  Discussed potential benefits, risk, and side effects of benzodiazepines to include potential risk  of tolerance and dependence, as well as possible drowsiness.  Advised patient not to drive if experiencing drowsiness and to take lowest possible effective dose to minimize risk of dependence and tolerance.  Discussed potential benefits, risks, and side effects of stimulants with patient to include increased heart rate, palpitations, insomnia, increased anxiety, increased irritability, or decreased appetite.  Instructed patient to contact office if experiencing any significant tolerability issues. There are no diagnoses linked to this encounter.   Please see After Visit Summary for patient specific instructions.  No future appointments.   No orders of the defined types were placed in this encounter.     -------------------------------

## 2023-01-19 ENCOUNTER — Telehealth (INDEPENDENT_AMBULATORY_CARE_PROVIDER_SITE_OTHER): Admitting: Adult Health

## 2023-01-19 ENCOUNTER — Encounter: Payer: Self-pay | Admitting: Adult Health

## 2023-01-19 DIAGNOSIS — F902 Attention-deficit hyperactivity disorder, combined type: Secondary | ICD-10-CM

## 2023-01-19 DIAGNOSIS — G4700 Insomnia, unspecified: Secondary | ICD-10-CM | POA: Diagnosis not present

## 2023-01-19 DIAGNOSIS — F411 Generalized anxiety disorder: Secondary | ICD-10-CM

## 2023-01-19 DIAGNOSIS — F331 Major depressive disorder, recurrent, moderate: Secondary | ICD-10-CM | POA: Diagnosis not present

## 2023-01-19 MED ORDER — FLUOXETINE HCL 40 MG PO CAPS: ORAL_CAPSULE | ORAL | 5 refills | Status: AC

## 2023-01-19 MED ORDER — GABAPENTIN 600 MG PO TABS: ORAL_TABLET | ORAL | 5 refills | Status: AC

## 2023-01-19 MED ORDER — LISDEXAMFETAMINE DIMESYLATE 70 MG PO CAPS: 70.0000 mg | ORAL_CAPSULE | Freq: Every day | ORAL | 0 refills | Status: DC

## 2023-01-19 MED ORDER — LISDEXAMFETAMINE DIMESYLATE 70 MG PO CAPS: 70.0000 mg | ORAL_CAPSULE | ORAL | 0 refills | Status: DC

## 2023-01-19 MED ORDER — FLUOXETINE HCL 10 MG PO CAPS: 10.0000 mg | ORAL_CAPSULE | Freq: Every day | ORAL | 5 refills | Status: AC

## 2023-01-19 MED ORDER — AMPHETAMINE-DEXTROAMPHETAMINE 20 MG PO TABS: 20.0000 mg | ORAL_TABLET | Freq: Every day | ORAL | 0 refills | Status: DC

## 2023-01-19 NOTE — Progress Notes (Signed)
[name] [id number] [birth year] 37 y.o.  Virtual Visit via Video Note  I connected with pt @ on 01/19/23 at  3:00 PM EDT by a video enabled telemedicine application and verified that I am speaking with the correct person using two identifiers.   I discussed the limitations of evaluation and management by telemedicine and the availability of in person appointments. The patient expressed understanding and agreed to proceed.  I discussed the assessment and treatment plan with the patient. The patient was provided an opportunity to ask questions and all were answered. The patient agreed with the plan and demonstrated an understanding of the instructions.   The patient was advised to call back or seek an in-person evaluation if the symptoms worsen or if the condition fails to improve as anticipated.  I provided 25 minutes of non-face-to-face time during this encounter.  The patient was located at home.  The provider was located at [facility].   [name], NP   Subjective:   Patient ID:  Kathryn Henry is a 37 y.o. (DOB April 05, 1986) female.  Chief Complaint: No chief complaint on file.   HPI Kathryn Henry presents for follow-up of GAD, MDD, OCD, insomnia, and ADHD.  Describes mood today as "ok". Pleasant. Denies tearfulness. Mood symptoms - denies depression, anxiety, and irritability. Denies panic attacks. Denies worry, rumination, and over thinking. Mood is consistent. Stating "I'm doing ok". Feels like medications are helpful. Family doing well. Stable interest and motivation. Taking medications as prescribed.  Energy levels lower. Active, does not have a regular exercise routine.  Enjoys some usual interests and activities. Married. Lives with husband, daughter and dog. Appetite adequate. Weight loss - 155 pounds - 66". Sleeps well most nights. Averages 7 to 9 hours.  Focus and concentration difficulties. Completing tasks. Managing aspects of household. Stay  at home mom. Denies SI or HI.  Denies AH or VH.  Denies self harm. Denies substance use.  Seeing Neurologist for headaches  Previous medications: Abilify, Celexa, Wellbutrin, Prozac, Latuda, Rexulti, Cogentin, Lamictal-rash   Review of Systems:  Review of Systems  Musculoskeletal:  Negative for gait problem.  Neurological:  Negative for tremors.  Psychiatric/Behavioral:         Please refer to HPI    Medications: I have reviewed the patient's current medications.  Current Outpatient Medications  Medication Sig Dispense Refill   amoxicillin-clavulanate (AUGMENTIN) 875-125 MG tablet Take 1 tablet by mouth 2 (two) times daily. 14 tablet 0   diazepam (VALIUM) 5 MG tablet TAKE 1 TABLET(5 MG) BY MOUTH THREE TIMES DAILY AS NEEDED 90 tablet 2   drospirenone-ethinyl estradiol (YAZ) 3-0.02 MG tablet TK 1 T PO QD     FLUoxetine (PROZAC) 10 MG capsule Take 1 capsule (10 mg total) by mouth daily. 30 capsule 5   FLUoxetine (PROZAC) 40 MG capsule TAKE ONE CAPSULE BY MOUTH DAILY 30 capsule 5   fluticasone (FLONASE) 50 MCG/ACT nasal spray Place 2 sprays into both nostrils daily. 16 g 0   gabapentin (NEURONTIN) 600 MG tablet TAKE 1 TABLET(600 MG) BY MOUTH FOUR TIMES DAILY 120 tablet 5   ketoconazole (NIZORAL) 2 % cream Apply 1 application topically daily.  2   lisdexamfetamine (VYVANSE) 70 MG capsule Take 1 capsule (70 mg total) by mouth daily. 30 capsule 0   lisdexamfetamine (VYVANSE) 70 MG capsule Take 1 capsule (70 mg total) by mouth daily. 30 capsule 0   lisdexamfetamine (VYVANSE) 70 MG capsule Take 1 capsule (70 mg total) by mouth every morning.  30 capsule 0   No current facility-administered medications for this visit.    Medication Side Effects: None  Allergies: No Known Allergies  No past medical history on file.  No family history on file.  Social History   Socioeconomic History   Marital status: Married    Spouse name: Not on file   Number of children: Not on file   Years  of education: Not on file   Highest education level: Not on file  Occupational History   Not on file  Tobacco Use   Smoking status: Former   Smokeless tobacco: Never  Vaping Use   Vaping Use: Never used  Substance and Sexual Activity   Alcohol use: Never   Drug use: Never   Sexual activity: Not on file  Other Topics Concern   Not on file  Social History Narrative   Not on file   Social Determinants of Health   Financial Resource Strain: Not on file  Food Insecurity: Not on file  Transportation Needs: Not on file  Physical Activity: Not on file  Stress: Not on file  Social Connections: Not on file  Intimate Partner Violence: Not on file    Past Medical History, Surgical history, Social history, and Family history were reviewed and updated as appropriate.   Please see review of systems for further details on the patient's review from today.   Objective:   Physical Exam:  There were no vitals taken for this visit.  Physical Exam Constitutional:      General: She is not in acute distress. Musculoskeletal:        General: No deformity.  Neurological:     Mental Status: She is alert and oriented to person, place, and time.     Coordination: Coordination normal.  Psychiatric:        Attention and Perception: Attention and perception normal. She does not perceive auditory or visual hallucinations.        Mood and Affect: Affect is not labile, blunt, angry or inappropriate.        Speech: Speech normal.        Behavior: Behavior normal.        Thought Content: Thought content normal. Thought content is not paranoid or delusional. Thought content does not include homicidal or suicidal ideation. Thought content does not include homicidal or suicidal plan.        Cognition and Memory: Cognition and memory normal.        Judgment: Judgment normal.     Comments: Insight intact     Lab Review:     Component Value Date/Time   NA 141 04/05/2018 1341   K 3.5 04/05/2018  1341   CL 104 04/05/2018 1341   CO2 27 04/05/2018 1341   GLUCOSE 92 04/05/2018 1341   BUN 10 04/05/2018 1341   CREATININE 0.65 04/05/2018 1341   CALCIUM 8.9 04/05/2018 1341   PROT 6.5 03/22/2018 1222   ALBUMIN 3.6 03/22/2018 1222   AST 19 03/22/2018 1222   ALT 14 03/22/2018 1222   ALKPHOS 73 03/22/2018 1222   BILITOT 0.6 03/22/2018 1222   GFRNONAA >60 04/05/2018 1341   GFRAA >60 04/05/2018 1341       Component Value Date/Time   WBC 7.7 04/05/2018 1341   RBC 4.34 04/05/2018 1341   HGB 13.5 04/05/2018 1341   HCT 40.0 04/05/2018 1341   PLT 357 04/05/2018 1341   MCV 92.2 04/05/2018 1341   MCH 31.1 04/05/2018 1341   MCHC 33.8  04/05/2018 1341   RDW 13.5 04/05/2018 1341   LYMPHSABS 1.5 03/22/2018 1222   MONOABS 0.4 03/22/2018 1222   EOSABS 0.1 03/22/2018 1222   BASOSABS 0.0 03/22/2018 1222    No results found for: "POCLITH", "LITHIUM"   No results found for: "PHENYTOIN", "PHENOBARB", "VALPROATE", "CBMZ"   .res Assessment: Plan:    Plan:  Add Adderall 20mg  daily - will call in 4 weeks with update on effectiveness.  Vyvanse 70mg  every morning  Gabapentin 600mg  4 times daily - RLS Prozac 50mg  (40 - 10) daily  Valium 5mg  up to twice a week - using as needed for migraines - none needed  Monitor BP between visits  RTC 3 months  Time spent with patient was 25 minutes. Greater than 50% of face to face time with patient was spent on counseling and coordination of care.   Patient advised to contact office with any questions, adverse effects, or acute worsening in signs and symptoms.  Discussed potential benefits, risk, and side effects of benzodiazepines to include potential risk of tolerance and dependence, as well as possible drowsiness.  Advised patient not to drive if experiencing drowsiness and to take lowest possible effective dose to minimize risk of dependence and tolerance.  Discussed potential benefits, risks, and side effects of stimulants with patient to  include increased heart rate, palpitations, insomnia, increased anxiety, increased irritability, or decreased appetite.  Instructed patient to contact office if experiencing any significant tolerability issues.  There are no diagnoses linked to this encounter.   Please see After Visit Summary for patient specific instructions.  Future Appointments  Date Time Provider Department Center  01/19/2023  3:00 PM Tanesha Arambula, Thereasa Solo, NP [facility]-CP None    No orders of the defined types were placed in this encounter.     -------------------------------

## 2023-01-19 NOTE — Addendum Note (Signed)
Addended by: [name] on: 01/19/2023 03:40 PM   Modules accepted: Level of Service

## 2023-03-01 ENCOUNTER — Other Ambulatory Visit: Payer: Self-pay

## 2023-03-01 ENCOUNTER — Telehealth: Payer: Self-pay | Admitting: Adult Health

## 2023-03-01 MED ORDER — AMPHETAMINE-DEXTROAMPHETAMINE 20 MG PO TABS: 20.0000 mg | ORAL_TABLET | Freq: Every day | ORAL | 0 refills | Status: DC

## 2023-03-01 NOTE — Telephone Encounter (Signed)
Pt called asking for a refill on her adderall  20 mg. It is working well. Please send to the  [facility] in [place] on [address]

## 2023-03-01 NOTE — Telephone Encounter (Signed)
Pended.

## 2023-04-01 ENCOUNTER — Telehealth: Payer: Self-pay | Admitting: Physician Assistant

## 2023-04-01 NOTE — Telephone Encounter (Signed)
Kathryn Henry called at 3:45 to request refill of her Vyvanse 70mg  and Adderall 20mg .  Appt 10/17.  Send to new pharmacy [phone], [place], [place] [address].  She has moved to [facility]. Her appt 10/17 is video.  We may need to CA that appt due to her location.

## 2023-04-01 NOTE — Telephone Encounter (Signed)
Will send in 3 Adderall RF to bridge her. Will verify that she knows she can't to telehealth visits and she needs to find another provider.

## 2023-04-02 ENCOUNTER — Other Ambulatory Visit: Payer: Self-pay

## 2023-04-02 DIAGNOSIS — F902 Attention-deficit hyperactivity disorder, combined type: Secondary | ICD-10-CM

## 2023-04-02 NOTE — Telephone Encounter (Signed)
Pended.

## 2023-04-02 NOTE — Telephone Encounter (Signed)
Please cancel her appt with Almira Coaster. She has moved to [facility].

## 2023-04-02 NOTE — Telephone Encounter (Signed)
LVM to [facility]

## 2023-04-02 NOTE — Telephone Encounter (Signed)
Yes

## 2023-04-05 ENCOUNTER — Other Ambulatory Visit: Payer: Self-pay

## 2023-04-05 DIAGNOSIS — F902 Attention-deficit hyperactivity disorder, combined type: Secondary | ICD-10-CM

## 2023-04-05 MED ORDER — LISDEXAMFETAMINE DIMESYLATE 70 MG PO CAPS
70.0000 mg | ORAL_CAPSULE | Freq: Every day | ORAL | 0 refills | Status: AC
Start: 2023-04-05 — End: ?

## 2023-04-05 MED ORDER — AMPHETAMINE-DEXTROAMPHETAMINE 20 MG PO TABS: 20.0000 mg | ORAL_TABLET | Freq: Every day | ORAL | 0 refills | Status: DC

## 2023-04-05 NOTE — Telephone Encounter (Signed)
Pended.

## 2023-04-05 NOTE — Telephone Encounter (Signed)
Kathryn Henry called at 2:21 to check status of transferring her medication OK.  We did cancel her appt since she will not be in [place].  Please send in her refills.

## 2023-04-06 ENCOUNTER — Other Ambulatory Visit: Payer: Self-pay

## 2023-04-06 DIAGNOSIS — F902 Attention-deficit hyperactivity disorder, combined type: Secondary | ICD-10-CM

## 2023-04-06 MED ORDER — AMPHETAMINE-DEXTROAMPHETAMINE 20 MG PO TABS: 20.0000 mg | ORAL_TABLET | Freq: Every day | ORAL | 0 refills | Status: DC

## 2023-04-06 MED ORDER — LISDEXAMFETAMINE DIMESYLATE 70 MG PO CAPS
70.0000 mg | ORAL_CAPSULE | Freq: Every day | ORAL | 0 refills | Status: AC
Start: 2023-04-06 — End: ?

## 2023-04-06 MED ORDER — LISDEXAMFETAMINE DIMESYLATE 70 MG PO CAPS
70.0000 mg | ORAL_CAPSULE | Freq: Every day | ORAL | 0 refills | Status: DC
Start: 2023-04-06 — End: 2023-06-14

## 2023-04-29 ENCOUNTER — Telehealth: Admitting: Adult Health

## 2023-04-30 ENCOUNTER — Encounter: Payer: Self-pay | Admitting: Adult Health

## 2023-05-07 ENCOUNTER — Telehealth: Payer: Self-pay | Admitting: Adult Health

## 2023-05-07 NOTE — Telephone Encounter (Signed)
Patient called in for refill on Vyvanse 70mg  and Adderall 20mg . Ph: [phone] Pharmacy [facility] [address] [place]

## 2023-05-11 ENCOUNTER — Other Ambulatory Visit: Payer: Self-pay

## 2023-05-11 DIAGNOSIS — F411 Generalized anxiety disorder: Secondary | ICD-10-CM

## 2023-05-11 DIAGNOSIS — F902 Attention-deficit hyperactivity disorder, combined type: Secondary | ICD-10-CM

## 2023-05-11 MED ORDER — AMPHETAMINE-DEXTROAMPHETAMINE 20 MG PO TABS: 20.0000 mg | ORAL_TABLET | Freq: Every day | ORAL | 0 refills | Status: AC

## 2023-05-11 MED ORDER — LISDEXAMFETAMINE DIMESYLATE 70 MG PO CAPS
70.0000 mg | ORAL_CAPSULE | ORAL | 0 refills | Status: AC
Start: 2023-05-11 — End: ?

## 2023-05-11 NOTE — Telephone Encounter (Signed)
Pt called again today asking for her refills

## 2023-05-11 NOTE — Telephone Encounter (Signed)
Pended to Dr. Jennelle Human, pharmacy will not accept Rx from [facility].

## 2023-06-14 ENCOUNTER — Other Ambulatory Visit: Payer: Self-pay

## 2023-06-14 ENCOUNTER — Telehealth: Payer: Self-pay | Admitting: Adult Health

## 2023-06-14 DIAGNOSIS — F902 Attention-deficit hyperactivity disorder, combined type: Secondary | ICD-10-CM

## 2023-06-14 MED ORDER — AMPHETAMINE-DEXTROAMPHETAMINE 20 MG PO TABS: 20.0000 mg | ORAL_TABLET | Freq: Every day | ORAL | 0 refills | Status: AC

## 2023-06-14 MED ORDER — LISDEXAMFETAMINE DIMESYLATE 70 MG PO CAPS: 70.0000 mg | ORAL_CAPSULE | Freq: Every day | ORAL | 0 refills | Status: AC

## 2023-06-14 NOTE — Telephone Encounter (Signed)
Kathryn Henry called at 10:25 to request refills of her Vyvanse and Adderall.  No appt because she has moved to [facility].  Has appt with new Dr. There in January.  Send to [id number] - [place], [place] - [address] AT [facility] [address]   Last month it looks like Dr. Jennelle Henry has to do the prescriptions due to Kathryn Henry policies.

## 2023-06-14 NOTE — Telephone Encounter (Signed)
Pended vyvanse and adderall to [facility]; to requested pharmacy.
